# Patient Record
Sex: Male | Born: 1990 | Race: Black or African American | Hispanic: No | Marital: Single | Smoking: Never smoker
Health system: Southern US, Community
[De-identification: ages and names within clinical notes are randomized; demographics above are authoritative.]

## PROBLEM LIST (undated history)

## (undated) DIAGNOSIS — G4730 Sleep apnea, unspecified: Secondary | ICD-10-CM

## (undated) DIAGNOSIS — R569 Unspecified convulsions: Secondary | ICD-10-CM

## (undated) DIAGNOSIS — F99 Mental disorder, not otherwise specified: Secondary | ICD-10-CM

## (undated) DIAGNOSIS — I10 Essential (primary) hypertension: Secondary | ICD-10-CM

## (undated) DIAGNOSIS — J45909 Unspecified asthma, uncomplicated: Secondary | ICD-10-CM

---
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
## 2010-04-22 ENCOUNTER — Emergency Department (HOSPITAL_COMMUNITY): Admission: EM | Admit: 2010-04-22 | Discharge: 2010-04-22 | Payer: Self-pay | Admitting: Emergency Medicine

## 2010-04-25 ENCOUNTER — Ambulatory Visit: Payer: Self-pay | Admitting: Family Medicine

## 2010-04-25 ENCOUNTER — Encounter: Payer: Self-pay | Admitting: Family Medicine

## 2010-04-25 DIAGNOSIS — J329 Chronic sinusitis, unspecified: Secondary | ICD-10-CM | POA: Insufficient documentation

## 2010-04-25 DIAGNOSIS — R350 Frequency of micturition: Secondary | ICD-10-CM

## 2010-04-25 DIAGNOSIS — G4733 Obstructive sleep apnea (adult) (pediatric): Secondary | ICD-10-CM | POA: Insufficient documentation

## 2010-04-25 LAB — CONVERTED CEMR LAB
BUN: 9 mg/dL (ref 6–23)
Bilirubin Urine: NEGATIVE
CO2: 25 meq/L (ref 19–32)
Calcium: 9.8 mg/dL (ref 8.4–10.5)
Chloride: 99 meq/L (ref 96–112)
Creatinine, Ser: 0.87 mg/dL (ref 0.40–1.50)
Glucose, Bld: 81 mg/dL (ref 70–99)
Glucose, Urine, Semiquant: NEGATIVE
Ketones, urine, test strip: NEGATIVE
Nitrite: NEGATIVE
Potassium: 4.6 meq/L (ref 3.5–5.3)
Protein, U semiquant: 30
Sodium: 137 meq/L (ref 135–145)
Specific Gravity, Urine: 1.015
Urobilinogen, UA: 0.2
WBC Urine, dipstick: NEGATIVE
pH: 7

## 2010-04-26 ENCOUNTER — Encounter: Payer: Self-pay | Admitting: Family Medicine

## 2010-07-18 ENCOUNTER — Encounter: Payer: Self-pay | Admitting: Family Medicine

## 2010-07-18 ENCOUNTER — Ambulatory Visit (HOSPITAL_BASED_OUTPATIENT_CLINIC_OR_DEPARTMENT_OTHER): Admission: RE | Admit: 2010-07-18 | Discharge: 2010-07-18 | Payer: Self-pay | Admitting: Family Medicine

## 2010-07-22 ENCOUNTER — Ambulatory Visit: Payer: Self-pay | Admitting: Internal Medicine

## 2010-08-21 ENCOUNTER — Ambulatory Visit: Payer: Self-pay | Admitting: Family Medicine

## 2010-10-08 ENCOUNTER — Telehealth: Payer: Self-pay | Admitting: Family Medicine

## 2011-01-03 NOTE — Progress Notes (Signed)
Summary: refill  Phone Note Refill Request Call back at [phone] Message from:  Patient  Refills Requested: Medication #1:  CPAP WITH LARGE RESMED MIRAGE QUATTRO MASK 19 CWP AHI 0 per hour  Heated humidified air  Medication #2:  CLARINEX 5 MG TABS one tab by mouth daily for allergies  Medication #3:  FLONASE 50 MCG/ACT SUSP one spray in each nostril two times a day  Medication #4:  HYDROCHLOROTHIAZIDE 25 MG TABS one tab by mouth qday. pt has moved and lost rx for these meds including the Cpap machine. need written script for these  Initial call taken by: De Nurse,  October 08, 2010 1:46 PM    Prescriptions: HYDROCHLOROTHIAZIDE 25 MG TABS (HYDROCHLOROTHIAZIDE) one tab by mouth qday  #30 x 3   Entered and Authorized by:   Bobby Rumpf  MD   Signed by:   Bobby Rumpf  MD on 10/09/2010   Method used:   Print then Give to Patient   RxID:   [id number] CPAP WITH LARGE RESMED MIRAGE QUATTRO MASK 19 CWP AHI 0 per hour  Heated humidified air  #1 x 0   Entered and Authorized by:   Bobby Rumpf  MD   Signed by:   Bobby Rumpf  MD on 10/09/2010   Method used:   Print then Give to Patient   RxID:   [id number] PROVENTIL HFA 108 (90 BASE) MCG/ACT AERS (ALBUTEROL SULFATE) two puffs inhaled q 4 hours as needed wheezing or short of breath  #1 x 1   Entered and Authorized by:   Bobby Rumpf  MD   Signed by:   Bobby Rumpf  MD on 10/09/2010   Method used:   Print then Give to Patient   RxID:   [id number] FLONASE 50 MCG/ACT SUSP (FLUTICASONE PROPIONATE) one spray in each nostril two times a day  #120 sprays x 3   Entered and Authorized by:   Bobby Rumpf  MD   Signed by:   Bobby Rumpf  MD on 10/09/2010   Method used:   Print then Give to Patient   RxID:   [id number] CLARINEX 5 MG TABS (DESLORATADINE) one tab by mouth daily for allergies  #30 x 3   Entered and Authorized by:   Bobby Rumpf  MD   Signed by:   Bobby Rumpf  MD on 10/09/2010   Method used:   Print then Give to  Patient   RxID:   [id number]  in to be called box. sign  Bobby Rumpf  MD  October 09, 2010 10:19 AM
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
## 2011-01-03 NOTE — Letter (Signed)
Summary: Generic Letter  [facility]  [address]   [place], [place] [address]   Phone: [phone]  Fax: [phone]    04/26/2010  Andrew Larson [address] [place], [place]  [address]  Dear Andrew Larson,  I tried to call you but didn't get an answer. I wanted to let you know that your blood tests returned normal. We tested you electrolytes and kidney function. Your urinalysis showed tiny amount of blood and protein. We will plan to recheck this test on your next visit. Please call with any questions.  Sincerely,   Myrtie Soman  MD  Appended Document: Generic Letter mailed.

## 2011-01-28 ENCOUNTER — Emergency Department (HOSPITAL_COMMUNITY)
Admission: EM | Admit: 2011-01-28 | Discharge: 2011-01-28 | Discharge status: Against Medical Advice | Payer: Self-pay | Attending: Emergency Medicine | Admitting: Emergency Medicine

## 2011-01-28 ENCOUNTER — Emergency Department (HOSPITAL_COMMUNITY): Payer: Self-pay

## 2011-01-28 DIAGNOSIS — M25519 Pain in unspecified shoulder: Secondary | ICD-10-CM | POA: Insufficient documentation

## 2011-06-11 ENCOUNTER — Emergency Department (HOSPITAL_COMMUNITY)
Admission: EM | Admit: 2011-06-11 | Discharge: 2011-06-11 | Disposition: A | Discharge status: Home / Self Care | Payer: No Typology Code available for payment source | Attending: Emergency Medicine | Admitting: Emergency Medicine

## 2011-06-11 DIAGNOSIS — S139XXA Sprain of joints and ligaments of unspecified parts of neck, initial encounter: Secondary | ICD-10-CM | POA: Insufficient documentation

## 2011-06-11 DIAGNOSIS — I10 Essential (primary) hypertension: Secondary | ICD-10-CM | POA: Insufficient documentation

## 2011-06-11 DIAGNOSIS — Y92410 Unspecified street and highway as the place of occurrence of the external cause: Secondary | ICD-10-CM | POA: Insufficient documentation

## 2011-06-11 DIAGNOSIS — M62838 Other muscle spasm: Secondary | ICD-10-CM | POA: Insufficient documentation

## 2011-06-13 ENCOUNTER — Emergency Department (HOSPITAL_COMMUNITY)
Admission: EM | Admit: 2011-06-13 | Discharge: 2011-06-13 | Disposition: A | Discharge status: Home / Self Care | Payer: No Typology Code available for payment source | Attending: Emergency Medicine | Admitting: Emergency Medicine

## 2011-06-13 DIAGNOSIS — T148XXA Other injury of unspecified body region, initial encounter: Secondary | ICD-10-CM | POA: Insufficient documentation

## 2011-06-13 DIAGNOSIS — I10 Essential (primary) hypertension: Secondary | ICD-10-CM | POA: Insufficient documentation

## 2011-06-13 DIAGNOSIS — M549 Dorsalgia, unspecified: Secondary | ICD-10-CM | POA: Insufficient documentation

## 2011-06-13 DIAGNOSIS — M542 Cervicalgia: Secondary | ICD-10-CM | POA: Insufficient documentation

## 2012-04-01 ENCOUNTER — Ambulatory Visit: Payer: No Typology Code available for payment source | Admitting: Family Medicine

## 2012-04-07 ENCOUNTER — Ambulatory Visit: Payer: No Typology Code available for payment source | Admitting: Family Medicine

## 2012-07-27 ENCOUNTER — Ambulatory Visit (INDEPENDENT_AMBULATORY_CARE_PROVIDER_SITE_OTHER): Payer: Self-pay | Admitting: Family Medicine

## 2012-07-27 VITALS — BP 161/96 | HR 89 | Temp 98.1°F | Ht 71.0 in | Wt 370.4 lb

## 2012-07-27 DIAGNOSIS — G4733 Obstructive sleep apnea (adult) (pediatric): Secondary | ICD-10-CM

## 2012-07-27 DIAGNOSIS — E669 Obesity, unspecified: Secondary | ICD-10-CM

## 2012-07-27 DIAGNOSIS — R5381 Other malaise: Secondary | ICD-10-CM

## 2012-07-27 DIAGNOSIS — E785 Hyperlipidemia, unspecified: Secondary | ICD-10-CM

## 2012-07-27 DIAGNOSIS — J45909 Unspecified asthma, uncomplicated: Secondary | ICD-10-CM

## 2012-07-27 DIAGNOSIS — I10 Essential (primary) hypertension: Secondary | ICD-10-CM

## 2012-07-27 DIAGNOSIS — R5383 Other fatigue: Secondary | ICD-10-CM

## 2012-07-27 LAB — CBC
MCH: 26.7 pg (ref 26.0–34.0)
MCV: 80.4 fL (ref 78.0–100.0)
Platelets: 420 10*3/uL — ABNORMAL HIGH (ref 150–400)
RBC: 5.09 MIL/uL (ref 4.22–5.81)
RDW: 14.4 % (ref 11.5–15.5)

## 2012-07-27 MED ORDER — SPACER/AERO-HOLDING CHAMBERS KIT: 1.0000 | PACK | Status: DC | PRN

## 2012-07-27 MED ORDER — HYDROCHLOROTHIAZIDE 25 MG PO TABS: 25.0000 mg | ORAL_TABLET | Freq: Every day | ORAL | Status: DC

## 2012-07-27 MED ORDER — ALBUTEROL SULFATE HFA 108 (90 BASE) MCG/ACT IN AERS: 2.0000 | INHALATION_SPRAY | RESPIRATORY_TRACT | Status: DC | PRN

## 2012-07-27 NOTE — Patient Instructions (Addendum)
Thank you for coming in today Please start taking your albuterol as needed when you are short of breath Please start taking hour HCTZ Please come back to see me in 1-2 weeks Please arrange to have a sleeping study  Have a great day

## 2012-07-28 LAB — LIPID PANEL
Cholesterol: 193 mg/dL (ref 0–200)
LDL Cholesterol: 100 mg/dL — ABNORMAL HIGH (ref 0–99)
Total CHOL/HDL Ratio: 6 Ratio
Triglycerides: 303 mg/dL — ABNORMAL HIGH (ref <150)
VLDL: 61 mg/dL — ABNORMAL HIGH (ref 0–40)

## 2012-07-28 LAB — BASIC METABOLIC PANEL
BUN: 8 mg/dL (ref 6–23)
Chloride: 102 mEq/L (ref 96–112)
Glucose, Bld: 86 mg/dL (ref 70–99)
Potassium: 4.3 mEq/L (ref 3.5–5.3)
Sodium: 137 mEq/L (ref 135–145)

## 2012-07-30 NOTE — Assessment & Plan Note (Signed)
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 Sleep study CBC TSH

## 2012-07-30 NOTE — Assessment & Plan Note (Signed)
Start HCTZ troday

## 2012-07-30 NOTE — Assessment & Plan Note (Signed)
Refill albuterol w/ spacer today.

## 2012-07-30 NOTE — Assessment & Plan Note (Signed)
Severely obese. Pt would benefit greatly from meeting w/ health coach. Interested in losing wt but poor insight as to how to properly do this.  Lipid panel BMET

## 2012-07-30 NOTE — Assessment & Plan Note (Signed)
Previously seen in sleep lab for this but did not fill out Sleep lab

## 2012-07-30 NOTE — Progress Notes (Signed)
  Subjective:    Patient ID: Andrew Larson, male    DOB: 1991/09/22, 21 y.o.   MRN: [id number]  HPI CC: reestablish care. HTN, SOB, overweight  HTN: Hypertensive in office. Has not taken previously prescribbed HCTZ in years due to loss of medical coverage. Has been taking mothers bp pills. Not sure which they are. Denies HA, CP, palpitations, Syncope, diaphoresis  SOB: has had whole life. Dx w/ Asthma as child. owrsens w/ illnesses and change in seasons. No ER visits or hospitalizations. No current inhalers. Denies recent wheezing or coughing  Obesity: Pt aware of obesity. Dances for exercise 2-3hrs daily and walks around the block once occasionally. Eats once daily. Pt snores nightly, wakes up tired and often has HA in am  H/o epistaxis: No longer a problem. Has stopped using flonase  FamHX; Father w/ stroke and heart attack, cousin w/ DM, mother w/ HTN PMHx reviewed  Social hx reviewed Review of Systems Per HPI    Objective:   Physical Exam BP 161/96  Pulse 89  Temp 98.1 F (36.7 C) (Oral)  Ht 5\' 11"  (1.803 m)  Wt 370 lb 6.4 oz (168.012 kg)  BMI 51.66 kg/m2  SpO2 93%   Gen: Obese, NAD HEENt: TM normal bilat, neck soft, trachea midline, thyroid normal CV: RRR, no m/r/g RES: mild end expiratory wheezes, good air movement, normal effrot MUsc: normal ROM, no effusions Ext: No edema, 2+ pulses Skin: Acanthosis Nigricans of neck, intact Neuro: CN grossly intact, normal gait Psych: Pleasant, normal affect, AAOx3       Assessment & Plan:

## 2012-07-31 NOTE — Progress Notes (Signed)
Yes orange card can see me.  Let me know how I can help.

## 2012-08-07 NOTE — Progress Notes (Signed)
Call pt to set up health coaching appointment.  Phone number we have on record is disconnected.

## 2012-08-14 NOTE — Progress Notes (Signed)
Call pt to talk about fu visit with pcp and potential health coaching.  Pt's phone was turned off.

## 2012-08-17 ENCOUNTER — Ambulatory Visit (HOSPITAL_BASED_OUTPATIENT_CLINIC_OR_DEPARTMENT_OTHER): Payer: Self-pay

## 2012-10-27 ENCOUNTER — Ambulatory Visit (HOSPITAL_BASED_OUTPATIENT_CLINIC_OR_DEPARTMENT_OTHER): Payer: Self-pay

## 2012-11-10 ENCOUNTER — Encounter: Payer: Self-pay | Admitting: Family Medicine

## 2012-11-10 DIAGNOSIS — E785 Hyperlipidemia, unspecified: Secondary | ICD-10-CM | POA: Insufficient documentation

## 2012-11-10 NOTE — Assessment & Plan Note (Signed)
Multiple attempts have been made to contact pt but the phone number provided at time of last visit is not working. Need to discuss lipid panel w/ pt.  Hard copy mailed out

## 2012-12-20 ENCOUNTER — Emergency Department (HOSPITAL_COMMUNITY)
Admission: EM | Admit: 2012-12-20 | Discharge: 2012-12-20 | Disposition: A | Discharge status: Home / Self Care | Payer: Self-pay | Attending: Emergency Medicine | Admitting: Emergency Medicine

## 2012-12-20 ENCOUNTER — Encounter (HOSPITAL_COMMUNITY): Payer: Self-pay | Admitting: Emergency Medicine

## 2012-12-20 DIAGNOSIS — J329 Chronic sinusitis, unspecified: Secondary | ICD-10-CM

## 2012-12-20 DIAGNOSIS — Z8669 Personal history of other diseases of the nervous system and sense organs: Secondary | ICD-10-CM | POA: Insufficient documentation

## 2012-12-20 DIAGNOSIS — J069 Acute upper respiratory infection, unspecified: Secondary | ICD-10-CM | POA: Insufficient documentation

## 2012-12-20 DIAGNOSIS — E669 Obesity, unspecified: Secondary | ICD-10-CM | POA: Insufficient documentation

## 2012-12-20 DIAGNOSIS — J45909 Unspecified asthma, uncomplicated: Secondary | ICD-10-CM | POA: Insufficient documentation

## 2012-12-20 DIAGNOSIS — J019 Acute sinusitis, unspecified: Secondary | ICD-10-CM | POA: Insufficient documentation

## 2012-12-20 DIAGNOSIS — Z8709 Personal history of other diseases of the respiratory system: Secondary | ICD-10-CM | POA: Insufficient documentation

## 2012-12-20 DIAGNOSIS — Z79899 Other long term (current) drug therapy: Secondary | ICD-10-CM | POA: Insufficient documentation

## 2012-12-20 HISTORY — DX: Unspecified convulsions: R56.9

## 2012-12-20 HISTORY — DX: Sleep apnea, unspecified: G47.30

## 2012-12-20 HISTORY — DX: Essential (primary) hypertension: I10

## 2012-12-20 HISTORY — DX: Unspecified asthma, uncomplicated: J45.909

## 2012-12-20 MED ORDER — HYDROCHLOROTHIAZIDE 25 MG PO TABS: 25.0000 mg | ORAL_TABLET | Freq: Every day | ORAL | Status: DC

## 2012-12-20 MED ORDER — DM-GG & DM-APAP-CPM 10-20 &15-200-2 MG PO MISC: 1.0000 | Freq: Two times a day (BID) | ORAL | Status: DC

## 2012-12-20 NOTE — ED Notes (Addendum)
Pt states that he has a hx of hyperension and has been having headaches and light sensitivity since Friday. States his bp was 180/85 at home. He is supposed to take HCTZ but does not. Neuro intact.

## 2012-12-20 NOTE — ED Provider Notes (Signed)
History     CSN: [id number]  Arrival date & time 12/20/12  1816   First MD Initiated Contact with Patient 12/20/12 2053      Chief Complaint  Patient presents with  . Hypertension    (Consider location/radiation/quality/duration/timing/severity/associated sxs/prior treatment) HPI Comments: Patient presents with L facial pain, headache, sneezing for the past 3 days took Ibuprofen at 3AM with relief but the HA returned.  Has not taken any medication for nasal congestion, nor his blood pressure medication for the past 3 months.  The Rx is at [facility] he just has not gone to pick it up. Denies sore throat, CP cough, blurry vision, dizziness SOB.   Patient is a 22 y.o. male presenting with hypertension. The history is provided by the patient.  Hypertension This is a chronic problem. The current episode started more than 1 year ago. The problem occurs constantly. The problem has been unchanged. Associated symptoms include headaches. Pertinent negatives include no abdominal pain, anorexia, arthralgias, chest pain, chills, coughing, fatigue, fever, myalgias, nausea, rash, sore throat or weakness.    Past Medical History  Diagnosis Date  . Hypertension   . Seizures   . Sleep apnea   . Asthma     No past surgical history on file.  No family history on file.  History  Substance Use Topics  . Smoking status: Not on file  . Smokeless tobacco: Not on file  . Alcohol Use:       Review of Systems  Constitutional: Negative for fever, chills and fatigue.  HENT: Positive for rhinorrhea, sneezing and sinus pressure. Negative for sore throat.   Eyes: Negative for visual disturbance.  Respiratory: Negative for cough and shortness of breath.   Cardiovascular: Negative for chest pain and leg swelling.  Gastrointestinal: Negative for nausea, abdominal pain and anorexia.  Genitourinary: Negative.   Musculoskeletal: Negative for myalgias and arthralgias.  Skin: Negative for rash.    Neurological: Positive for headaches. Negative for dizziness and weakness.    Allergies  Review of patient's allergies indicates no known allergies.  Home Medications   Current Outpatient Rx  Name  Route  Sig  Dispense  Refill  . IBUPROFEN 200 MG PO TABS   Oral   Take 800 mg by mouth every 8 (eight) hours as needed. For pain.         . ALBUTEROL SULFATE HFA 108 (90 BASE) MCG/ACT IN AERS   Inhalation   Inhale 2 puffs into the lungs every 4 (four) hours as needed.   1 Inhaler   3   . DESLORATADINE 5 MG PO TABS   Oral   Take 5 mg by mouth daily.           Marland Kitchen DM-GG & DM-APAP-CPM 10-20 &15-200-2 MG PO MISC   Oral   Take 1 tablet by mouth 2 (two) times daily.   30 each   0   . HYDROCHLOROTHIAZIDE 25 MG PO TABS   Oral   Take 1 tablet (25 mg total) by mouth daily.   30 tablet   0   . SPACER/AERO-HOLDING CHAMBERS KIT   Does not apply   1 kit by Does not apply route as needed.   2 each   1     BP 151/99  Pulse 84  Temp 98.9 F (37.2 C)  SpO2 99%  Physical Exam  Constitutional: He is oriented to person, place, and time. He appears well-developed and well-nourished.       obese  HENT:  Head: Normocephalic.  Right Ear: External ear normal.  Left Ear: External ear normal.  Nose: Left sinus exhibits maxillary sinus tenderness and frontal sinus tenderness.  Mouth/Throat: Oropharynx is clear and moist.  Eyes: Pupils are equal, round, and reactive to light.  Neck: Normal range of motion.  Cardiovascular: Normal rate.   Pulmonary/Chest: Effort normal and breath sounds normal. He has no wheezes. He exhibits no tenderness.  Abdominal: Soft.  Musculoskeletal: Normal range of motion. He exhibits no edema and no tenderness.  Neurological: He is alert and oriented to person, place, and time.  Skin: Skin is warm and dry. No rash noted.    ED Course  Procedures (including critical care time)  Labs Reviewed - No data to display No results found.   1. URI (upper  respiratory infection)   2. Sinusitis       MDM   Discussed at length importance of taking his blood pressure mediation on a regular basis.  Also discussed OTC medications that are safe with HTN         Arman Filter, NP 12/20/12 2121

## 2012-12-23 ENCOUNTER — Encounter: Payer: Self-pay | Admitting: Family Medicine

## 2012-12-23 ENCOUNTER — Ambulatory Visit (INDEPENDENT_AMBULATORY_CARE_PROVIDER_SITE_OTHER): Payer: No Typology Code available for payment source | Admitting: Family Medicine

## 2012-12-23 VITALS — BP 146/95 | HR 92 | Temp 98.4°F | Ht 71.0 in | Wt 366.0 lb

## 2012-12-23 DIAGNOSIS — M79671 Pain in right foot: Secondary | ICD-10-CM

## 2012-12-23 DIAGNOSIS — I10 Essential (primary) hypertension: Secondary | ICD-10-CM

## 2012-12-23 DIAGNOSIS — M79609 Pain in unspecified limb: Secondary | ICD-10-CM

## 2012-12-23 DIAGNOSIS — J069 Acute upper respiratory infection, unspecified: Secondary | ICD-10-CM

## 2012-12-23 DIAGNOSIS — J329 Chronic sinusitis, unspecified: Secondary | ICD-10-CM

## 2012-12-23 MED ORDER — FLUTICASONE PROPIONATE 50 MCG/ACT NA SUSP: 2.0000 | Freq: Every day | NASAL | Status: DC

## 2012-12-23 NOTE — Patient Instructions (Addendum)
THank you for coming in today Your blood pressure is still elevated and taking your HCTZ is very important Please come back to see me in 1 month to follow up on your blood pressure Keeping this under control is very important to your long-term health Please start using Flonase every day. If you develop a bloody nose stop immediately and let me know Start using a saline nasal spray and mucinex for your sinus congestion and runny nose.  You may use pseudophed as well if your blood pressure is below 140/80 Start wearing supportive sneakers for your foot.  Rest and elevation are important to help it heal You may use 600 ibuprofen every 6 hours as needed for pain. Please call 24/7 if you have any questions Have a great day  Hypertension As your heart beats, it forces blood through your arteries. This force is your blood pressure. If the pressure is too high, it is called hypertension (HTN) or high blood pressure. HTN is dangerous because you may have it and not know it. High blood pressure may mean that your heart has to work harder to pump blood. Your arteries may be narrow or stiff. The extra work puts you at risk for heart disease, stroke, and other problems.  Blood pressure consists of two numbers, a higher number over a lower, 110/72, for example. It is stated as "110 over 72." The ideal is below 120 for the top number (systolic) and under 80 for the bottom (diastolic). Write down your blood pressure today. You should pay close attention to your blood pressure if you have certain conditions such as:  Heart failure.  Prior heart attack.  Diabetes  Chronic kidney disease.  Prior stroke.  Multiple risk factors for heart disease. To see if you have HTN, your blood pressure should be measured while you are seated with your arm held at the level of the heart. It should be measured at least twice. A one-time elevated blood pressure reading (especially in the Emergency Department) does not mean  that you need treatment. There may be conditions in which the blood pressure is different between your right and left arms. It is important to see your caregiver soon for a recheck. Most people have essential hypertension which means that there is not a specific cause. This type of high blood pressure may be lowered by changing lifestyle factors such as:  Stress.  Smoking.  Lack of exercise.  Excessive weight.  Drug/tobacco/alcohol use.  Eating less salt. Most people do not have symptoms from high blood pressure until it has caused damage to the body. Effective treatment can often prevent, delay or reduce that damage. TREATMENT  When a cause has been identified, treatment for high blood pressure is directed at the cause. There are a large number of medications to treat HTN. These fall into several categories, and your caregiver will help you select the medicines that are best for you. Medications may have side effects. You should review side effects with your caregiver. If your blood pressure stays high after you have made lifestyle changes or started on medicines,   Your medication(s) may need to be changed.  Other problems may need to be addressed.  Be certain you understand your prescriptions, and know how and when to take your medicine.  Be sure to follow up with your caregiver within the time frame advised (usually within two weeks) to have your blood pressure rechecked and to review your medications.  If you are taking more than  one medicine to lower your blood pressure, make sure you know how and at what times they should be taken. Taking two medicines at the same time can result in blood pressure that is too low. SEEK IMMEDIATE MEDICAL CARE IF:  You develop a severe headache, blurred or changing vision, or confusion.  You have unusual weakness or numbness, or a faint feeling.  You have severe chest or abdominal pain, vomiting, or breathing problems. MAKE SURE YOU:    Understand these instructions.  Will watch your condition.  Will get help right away if you are not doing well or get worse. Document Released: 11/18/2005 Document Revised: 02/10/2012 Document Reviewed: 07/08/2008 [facility] Patient Information 2013 [place], [place].   Upper Respiratory Infection, Adult An upper respiratory infection (URI) is also sometimes known as the common cold. The upper respiratory tract includes the nose, sinuses, throat, trachea, and bronchi. Bronchi are the airways leading to the lungs. Most people improve within 1 week, but symptoms can last up to 2 weeks. A residual cough may last even longer.  CAUSES Many different viruses can infect the tissues lining the upper respiratory tract. The tissues become irritated and inflamed and often become very moist. Mucus production is also common. A cold is contagious. You can easily spread the virus to others by oral contact. This includes kissing, sharing a glass, coughing, or sneezing. Touching your mouth or nose and then touching a surface, which is then touched by another person, can also spread the virus. SYMPTOMS  Symptoms typically develop 1 to 3 days after you come in contact with a cold virus. Symptoms vary from person to person. They may include:  Runny nose.  Sneezing.  Nasal congestion.  Sinus irritation.  Sore throat.  Loss of voice (laryngitis).  Cough.  Fatigue.  Muscle aches.  Loss of appetite.  Headache.  Low-grade fever. DIAGNOSIS  You might diagnose your own cold based on familiar symptoms, since most people get a cold 2 to 3 times a year. Your caregiver can confirm this based on your exam. Most importantly, your caregiver can check that your symptoms are not due to another disease such as strep throat, sinusitis, pneumonia, asthma, or epiglottitis. Blood tests, throat tests, and X-rays are not necessary to diagnose a common cold, but they may sometimes be helpful in excluding other more  serious diseases. Your caregiver will decide if any further tests are required. RISKS AND COMPLICATIONS  You may be at risk for a more severe case of the common cold if you smoke cigarettes, have chronic heart disease (such as heart failure) or lung disease (such as asthma), or if you have a weakened immune system. The very young and very old are also at risk for more serious infections. Bacterial sinusitis, middle ear infections, and bacterial pneumonia can complicate the common cold. The common cold can worsen asthma and chronic obstructive pulmonary disease (COPD). Sometimes, these complications can require emergency medical care and may be life-threatening. PREVENTION  The best way to protect against getting a cold is to practice good hygiene. Avoid oral or hand contact with people with cold symptoms. Wash your hands often if contact occurs. There is no clear evidence that vitamin C, vitamin E, echinacea, or exercise reduces the chance of developing a cold. However, it is always recommended to get plenty of rest and practice good nutrition. TREATMENT  Treatment is directed at relieving symptoms. There is no cure. Antibiotics are not effective, because the infection is caused by a virus, not by  bacteria. Treatment may include:  Increased fluid intake. Sports drinks offer valuable electrolytes, sugars, and fluids.  Breathing heated mist or steam (vaporizer or shower).  Eating chicken soup or other clear broths, and maintaining good nutrition.  Getting plenty of rest.  Using gargles or lozenges for comfort.  Controlling fevers with ibuprofen or acetaminophen as directed by your caregiver.  Increasing usage of your inhaler if you have asthma. Zinc gel and zinc lozenges, taken in the first 24 hours of the common cold, can shorten the duration and lessen the severity of symptoms. Pain medicines may help with fever, muscle aches, and throat pain. A variety of non-prescription medicines are  available to treat congestion and runny nose. Your caregiver can make recommendations and may suggest nasal or lung inhalers for other symptoms.  HOME CARE INSTRUCTIONS   Only take over-the-counter or prescription medicines for pain, discomfort, or fever as directed by your caregiver.  Use a warm mist humidifier or inhale steam from a shower to increase air moisture. This may keep secretions moist and make it easier to breathe.  Drink enough water and fluids to keep your urine clear or pale yellow.  Rest as needed.  Return to work when your temperature has returned to normal or as your caregiver advises. You may need to stay home longer to avoid infecting others. You can also use a face mask and careful hand washing to prevent spread of the virus. SEEK MEDICAL CARE IF:   After the first few days, you feel you are getting worse rather than better.  You need your caregiver's advice about medicines to control symptoms.  You develop chills, worsening shortness of breath, or brown or red sputum. These may be signs of pneumonia.  You develop yellow or brown nasal discharge or pain in the face, especially when you bend forward. These may be signs of sinusitis.  You develop a fever, swollen neck glands, pain with swallowing, or white areas in the back of your throat. These may be signs of strep throat. SEEK IMMEDIATE MEDICAL CARE IF:   You have a fever.  You develop severe or persistent headache, ear pain, sinus pain, or chest pain.  You develop wheezing, a prolonged cough, cough up blood, or have a change in your usual mucus (if you have chronic lung disease).  You develop sore muscles or a stiff neck. Document Released: 05/14/2001 Document Revised: 02/10/2012 Document Reviewed: 03/22/2011 [facility] Patient Information 2013 [place], [place].

## 2012-12-23 NOTE — Progress Notes (Signed)
Andrew Larson is a 22 y.o. male who presents to [facility] today for foot pain  Foot pain: started Sunday night. Wearing flat shoes for several days w/ slowly increasing pain. Started wearing flat shoes since November. Hurts in the middle. Better or worse w/ certain movements. No Rx taken  BP: Started taking HCTZ on Monday. Rx initially given back in August. Said too expensive or didn't think about it. Denies CP, palpitations  URI: cold symptoms start 7 days ago. ED on Sunday, told had viral URI. Sinus congestion, runny nose, puffy eyes. Tylenol cold w/ benefit. Denies fever, n/v/d/c.    The following portions of the patient's history were reviewed and updated as appropriate: allergies, current medications, past medical history, family and social history, and problem list.  Patient is a nonsmoker  Past Medical History  Diagnosis Date  . Hypertension   . Seizures   . Sleep apnea   . Asthma     ROS as above otherwise neg.    Medications reviewed. Current Outpatient Prescriptions  Medication Sig Dispense Refill  . albuterol (PROVENTIL HFA) 108 (90 BASE) MCG/ACT inhaler Inhale 2 puffs into the lungs every 4 (four) hours as needed.  1 Inhaler  3  . desloratadine (CLARINEX) 5 MG tablet Take 5 mg by mouth daily.        Marland Kitchen DM-GG & DM-APAP-CPM (CORICIDIN HBP DAY/NIGHT COLD) 10-20 &15-200-2 MG MISC Take 1 tablet by mouth 2 (two) times daily.  30 each  0  . hydrochlorothiazide (HYDRODIURIL) 25 MG tablet Take 1 tablet (25 mg total) by mouth daily.  30 tablet  0  . ibuprofen (ADVIL,MOTRIN) 200 MG tablet Take 800 mg by mouth every 8 (eight) hours as needed. For pain.      Marland Kitchen Spacer/Aero-Holding Chambers KIT 1 kit by Does not apply route as needed.  2 each  1    Exam: BP 146/95  Pulse 92  Temp 98.4 F (36.9 C) (Oral)  Ht 5\' 11"  (1.803 m)  Wt 366 lb (166.017 kg)  BMI 51.05 kg/m2 Gen: Well NAD HEENT: EOMI,  MMM MSK: pain on deep palpation of the lateral plantar surface of the R foot. ROM and  sensation preserved. No swelling or bony abnormality Exts: Non edematous BL  LE, warm and well perfused.   No results found for this or any previous visit (from the past 72 hour(s)).

## 2012-12-24 ENCOUNTER — Telehealth: Payer: Self-pay | Admitting: Family Medicine

## 2012-12-24 NOTE — Telephone Encounter (Signed)
Left message on vm to rtn my call letting me know were she would like the letter sent or faxed to. Aigner Horseman, Virgel Bouquet

## 2012-12-24 NOTE — Telephone Encounter (Signed)
Pt could not go to work again today and needs another note to out today

## 2012-12-25 ENCOUNTER — Encounter: Payer: Self-pay | Admitting: *Deleted

## 2012-12-27 NOTE — ED Provider Notes (Signed)
Medical screening examination/treatment/procedure(s) were performed by non-physician practitioner and as supervising physician I was immediately available for consultation/collaboration.  Giannina Bartolome M Nayzeth Altman, MD 12/27/12 2308 

## 2012-12-29 ENCOUNTER — Encounter: Payer: Self-pay | Admitting: Family Medicine

## 2012-12-29 DIAGNOSIS — J069 Acute upper respiratory infection, unspecified: Secondary | ICD-10-CM | POA: Insufficient documentation

## 2012-12-29 DIAGNOSIS — M79671 Pain in right foot: Secondary | ICD-10-CM | POA: Insufficient documentation

## 2012-12-29 NOTE — Assessment & Plan Note (Signed)
MSK pain. PT to purchase more supprotive footwear  Ibuprofen 600 PRN RICE discussed

## 2012-12-29 NOTE — Assessment & Plan Note (Signed)
Likely viral URI H/o allergic rhinitis and sinusitis Start flonase for relief Mucinex PRN Pseudophed if BP normalizes Precautions given

## 2012-12-29 NOTE — Assessment & Plan Note (Signed)
Continues to be elevated as pt only started his medication ~36hrs ago May need to titrate up vs add Lisinopril for renal protection Continue HCTZ

## 2013-01-22 ENCOUNTER — Ambulatory Visit (INDEPENDENT_AMBULATORY_CARE_PROVIDER_SITE_OTHER): Payer: Self-pay | Admitting: Family Medicine

## 2013-01-22 ENCOUNTER — Encounter: Payer: Self-pay | Admitting: Family Medicine

## 2013-01-22 VITALS — BP 160/90 | HR 88 | Ht 71.0 in | Wt 377.9 lb

## 2013-01-22 DIAGNOSIS — E785 Hyperlipidemia, unspecified: Secondary | ICD-10-CM

## 2013-01-22 DIAGNOSIS — E669 Obesity, unspecified: Secondary | ICD-10-CM

## 2013-01-22 DIAGNOSIS — J45909 Unspecified asthma, uncomplicated: Secondary | ICD-10-CM

## 2013-01-22 DIAGNOSIS — I10 Essential (primary) hypertension: Secondary | ICD-10-CM

## 2013-01-22 LAB — LDL CHOLESTEROL, DIRECT: Direct LDL: 127 mg/dL — ABNORMAL HIGH

## 2013-01-22 MED ORDER — LISINOPRIL 10 MG PO TABS: 10.0000 mg | ORAL_TABLET | Freq: Every day | ORAL | Status: DC

## 2013-01-22 NOTE — Patient Instructions (Addendum)
You are doing pretty well but we need to focus on getting your blood pressure down and your weight under control Please start taking the lisinopril. This is another blood pressure medicine Please come back to see me in 2 weeks to discuss your urinary concerns Please cut out all fatty/fried foods and start exercising.    Fat and Cholesterol Control Diet Cholesterol levels in your body are determined significantly by your diet. Cholesterol levels may also be related to heart disease. The following material helps to explain this relationship and discusses what you can do to help keep your heart healthy. Not all cholesterol is bad. Low-density lipoprotein (LDL) cholesterol is the "bad" cholesterol. It may cause fatty deposits to build up inside your arteries. High-density lipoprotein (HDL) cholesterol is "good." It helps to remove the "bad" LDL cholesterol from your blood. Cholesterol is a very important risk factor for heart disease. Other risk factors are high blood pressure, smoking, stress, heredity, and weight. The heart muscle gets its supply of blood through the coronary arteries. If your LDL cholesterol is high and your HDL cholesterol is low, you are at risk for having fatty deposits build up in your coronary arteries. This leaves less room through which blood can flow. Without sufficient blood and oxygen, the heart muscle cannot function properly and you may feel chest pains (angina pectoris). When a coronary artery closes up entirely, a part of the heart muscle may die causing a heart attack (myocardial infarction). CHECKING CHOLESTEROL When your caregiver sends your blood to a lab to be examined for cholesterol, a complete lipid (fat) profile may be done. With this test, the total amount of cholesterol and levels of LDL and HDL are determined. Triglycerides are a type of fat that circulates in the blood. They can also be used to determine heart disease risk. The list below describes what the  numbers should be: Test: Total Cholesterol.  Less than 200 mg/dl. Test: LDL "bad cholesterol."  Less than 100 mg/dl.  Less than 70 mg/dl if you are at very high risk of a heart attack or sudden cardiac death. Test: HDL "good cholesterol."  Greater than 50 mg/dl for women.  Greater than 40 mg/dl for men. Test: Triglycerides.  Less than 150 mg/dl. CONTROLLING CHOLESTEROL WITH DIET Although exercise and lifestyle factors are important, your diet is key. That is because certain foods are known to raise cholesterol and others to lower it. The goal is to balance foods for their effect on cholesterol and more importantly, to replace saturated and trans fat with other types of fat, such as monounsaturated fat, polyunsaturated fat, and omega-3 fatty acids. On average, a person should consume no more than 15 to 17 g of saturated fat daily. Saturated and trans fats are considered "bad" fats, and they will raise LDL cholesterol. Saturated fats are primarily found in animal products such as meats, butter, and cream. However, that does not mean you need to give up all your favorite foods. Today, there are good tasting, low-fat, low-cholesterol substitutes for most of the things you like to eat. Choose low-fat or nonfat alternatives. Choose round or loin cuts of red meat. These types of cuts are lowest in fat and cholesterol. Chicken (without the skin), fish, veal, and ground [place] breast are great choices. Eliminate fatty meats, such as hot dogs and salami. Even shellfish have little or no saturated fat. Have a 3 oz (85 g) portion when you eat lean meat, poultry, or fish. Trans fats are also called "  partially hydrogenated oils." They are oils that have been scientifically manipulated so that they are solid at room temperature resulting in a longer shelf life and improved taste and texture of foods in which they are added. Trans fats are found in stick margarine, some tub margarines, cookies, crackers, and  baked goods.  When baking and cooking, oils are a great substitute for butter. The monounsaturated oils are especially beneficial since it is believed they lower LDL and raise HDL. The oils you should avoid entirely are saturated tropical oils, such as coconut and palm.  Remember to eat a lot from food groups that are naturally free of saturated and trans fat, including fish, fruit, vegetables, beans, grains (barley, rice, couscous, bulgur wheat), and pasta (without cream sauces).  IDENTIFYING FOODS THAT LOWER CHOLESTEROL  Soluble fiber may lower your cholesterol. This type of fiber is found in fruits such as apples, vegetables such as broccoli, potatoes, and carrots, legumes such as beans, peas, and lentils, and grains such as barley. Foods fortified with plant sterols (phytosterol) may also lower cholesterol. You should eat at least 2 g per day of these foods for a cholesterol lowering effect.  Read package labels to identify low-saturated fats, trans fat free, and low-fat foods at the supermarket. Select cheeses that have only 2 to 3 g saturated fat per ounce. Use a heart-healthy tub margarine that is free of trans fats or partially hydrogenated oil. When buying baked goods (cookies, crackers), avoid partially hydrogenated oils. Breads and muffins should be made from whole grains (whole-wheat or whole oat flour, instead of "flour" or "enriched flour"). Buy non-creamy canned soups with reduced salt and no added fats.  FOOD PREPARATION TECHNIQUES  Never deep-fry. If you must fry, either stir-fry, which uses very little fat, or use non-stick cooking sprays. When possible, broil, bake, or roast meats, and steam vegetables. Instead of putting butter or margarine on vegetables, use lemon and herbs, applesauce, and cinnamon (for squash and sweet potatoes), nonfat yogurt, salsa, and low-fat dressings for salads.  LOW-SATURATED FAT / LOW-FAT FOOD SUBSTITUTES Meats / Saturated Fat (g)  Avoid: Steak, marbled (3  oz/85 g) / 11 g  Choose: Steak, lean (3 oz/85 g) / 4 g  Avoid: Hamburger (3 oz/85 g) / 7 g  Choose: Hamburger, lean (3 oz/85 g) / 5 g  Avoid: Ham (3 oz/85 g) / 6 g  Choose: Ham, lean cut (3 oz/85 g) / 2.4 g  Avoid: Chicken, with skin, dark meat (3 oz/85 g) / 4 g  Choose: Chicken, skin removed, dark meat (3 oz/85 g) / 2 g  Avoid: Chicken, with skin, light meat (3 oz/85 g) / 2.5 g  Choose: Chicken, skin removed, light meat (3 oz/85 g) / 1 g Dairy / Saturated Fat (g)  Avoid: Whole milk (1 cup) / 5 g  Choose: Low-fat milk, 2% (1 cup) / 3 g  Choose: Low-fat milk, 1% (1 cup) / 1.5 g  Choose: Skim milk (1 cup) / 0.3 g  Avoid: Hard cheese (1 oz/28 g) / 6 g  Choose: Skim milk cheese (1 oz/28 g) / 2 to 3 g  Avoid: Cottage cheese, 4% fat (1 cup) / 6.5 g  Choose: Low-fat cottage cheese, 1% fat (1 cup) / 1.5 g  Avoid: Ice cream (1 cup) / 9 g  Choose: Sherbet (1 cup) / 2.5 g  Choose: Nonfat frozen yogurt (1 cup) / 0.3 g  Choose: Frozen fruit bar / trace  Avoid: Whipped cream (1 tbs) /  3.5 g  Choose: Nondairy whipped topping (1 tbs) / 1 g Condiments / Saturated Fat (g)  Avoid: Mayonnaise (1 tbs) / 2 g  Choose: Low-fat mayonnaise (1 tbs) / 1 g  Avoid: Butter (1 tbs) / 7 g  Choose: Extra light margarine (1 tbs) / 1 g  Avoid: Coconut oil (1 tbs) / 11.8 g  Choose: Olive oil (1 tbs) / 1.8 g  Choose: Corn oil (1 tbs) / 1.7 g  Choose: Safflower oil (1 tbs) / 1.2 g  Choose: Sunflower oil (1 tbs) / 1.4 g  Choose: Soybean oil (1 tbs) / 2.4 g  Choose: Canola oil (1 tbs) / 1 g Document Released: 11/18/2005 Document Revised: 02/10/2012 Document Reviewed: 05/09/2011 [facility] Patient Information 2013 [place], [place].

## 2013-01-22 NOTE — Assessment & Plan Note (Signed)
Still elevated Continue HCTZ Starting Lisinopril

## 2013-01-22 NOTE — Assessment & Plan Note (Signed)
Direct LDL today  

## 2013-01-22 NOTE — Progress Notes (Signed)
Andrew Larson is a 22 y.o. male who presents to [facility] today for HTN f/u  HTN: taking HCTZ as prescribed. Denies CP, SOB, Palpitations.   Wt: Up 11lbs from previous exam. Poor diet. Eating burgers lately at work. Not exercising. Just got a gym membership and will start giong to the gym. Drinking whole milk. Lots of dressing on vegetables.   Asthma: uses albuterol approximately 2-3x wkly. No nightime dosing.   The following portions of the patient's history were reviewed and updated as appropriate: allergies, current medications, past medical history, family and social history, and problem list.  Patient is a nonsmoker.  Past Medical History  Diagnosis Date  . Hypertension   . Seizures   . Sleep apnea   . Asthma     ROS as above otherwise neg.    Medications reviewed. Current Outpatient Prescriptions  Medication Sig Dispense Refill  . albuterol (PROVENTIL HFA) 108 (90 BASE) MCG/ACT inhaler Inhale 2 puffs into the lungs every 4 (four) hours as needed.  1 Inhaler  3  . desloratadine (CLARINEX) 5 MG tablet Take 5 mg by mouth daily.        Marland Kitchen DM-GG & DM-APAP-CPM (CORICIDIN HBP DAY/NIGHT COLD) 10-20 &15-200-2 MG MISC Take 1 tablet by mouth 2 (two) times daily.  30 each  0  . fluticasone (FLONASE) 50 MCG/ACT nasal spray Place 2 sprays into the nose daily.  16 g  6  . hydrochlorothiazide (HYDRODIURIL) 25 MG tablet Take 1 tablet (25 mg total) by mouth daily.  30 tablet  0  . ibuprofen (ADVIL,MOTRIN) 200 MG tablet Take 800 mg by mouth every 8 (eight) hours as needed. For pain.      Marland Kitchen Spacer/Aero-Holding Chambers KIT 1 kit by Does not apply route as needed.  2 each  1   No current facility-administered medications for this visit.    Exam:  BP 160/90  Pulse 88  Ht 5\' 11"  (1.803 m)  Wt 377 lb 14.4 oz (171.414 kg)  BMI 52.73 kg/m2 Gen: Well NAD HEENT: EOMI,  MMM Lungs: Nl WOB Abd: obese Neuro: CN grossly intact. Ambulation nml  No results found for this or any previous visit (from the  past 72 hour(s)).

## 2013-01-22 NOTE — Assessment & Plan Note (Signed)
Only using Q2-3 wkly, and not entirely convinced that using for exacerbation vs being winded from being out of shape.  No change

## 2013-01-22 NOTE — Assessment & Plan Note (Signed)
Spent >5 minutes discussing wt and healthy food options Pt to start going to gym.  Encouraged

## 2013-02-23 ENCOUNTER — Ambulatory Visit: Payer: Self-pay | Admitting: Family Medicine

## 2013-12-27 ENCOUNTER — Ambulatory Visit: Payer: Self-pay

## 2013-12-28 ENCOUNTER — Encounter: Payer: Self-pay | Admitting: Family Medicine

## 2014-01-06 ENCOUNTER — Ambulatory Visit: Payer: Self-pay

## 2014-01-07 ENCOUNTER — Encounter: Payer: Self-pay | Admitting: Family Medicine

## 2014-01-07 ENCOUNTER — Ambulatory Visit (INDEPENDENT_AMBULATORY_CARE_PROVIDER_SITE_OTHER): Payer: No Typology Code available for payment source | Admitting: Family Medicine

## 2014-01-07 VITALS — BP 164/98 | HR 88 | Temp 98.6°F | Ht 71.0 in | Wt 386.0 lb

## 2014-01-07 DIAGNOSIS — E669 Obesity, unspecified: Secondary | ICD-10-CM

## 2014-01-07 DIAGNOSIS — Z23 Encounter for immunization: Secondary | ICD-10-CM

## 2014-01-07 DIAGNOSIS — E785 Hyperlipidemia, unspecified: Secondary | ICD-10-CM

## 2014-01-07 DIAGNOSIS — K649 Unspecified hemorrhoids: Secondary | ICD-10-CM

## 2014-01-07 DIAGNOSIS — G4733 Obstructive sleep apnea (adult) (pediatric): Secondary | ICD-10-CM

## 2014-01-07 DIAGNOSIS — I10 Essential (primary) hypertension: Secondary | ICD-10-CM

## 2014-01-07 MED ORDER — HYDROCHLOROTHIAZIDE 25 MG PO TABS: 25.0000 mg | ORAL_TABLET | Freq: Every day | ORAL | Status: DC

## 2014-01-07 MED ORDER — LISINOPRIL 10 MG PO TABS: 10.0000 mg | ORAL_TABLET | Freq: Every day | ORAL | Status: DC

## 2014-01-07 NOTE — Assessment & Plan Note (Signed)
Primarily prolapsed or bleeding internal hemorrhoids.  No need for in office banding/excision at this time. Will treat concervatively Inc fiber in diet, adn fluid intake, miralax, and wt loss and HTN control

## 2014-01-07 NOTE — Progress Notes (Signed)
Andrew Larson is a 23 y.o. male who presents to [facility] today for physical  Narcolepsy: Previously evaluated adn sent to sleep study but has not gone as missed appt and told needs another appt. Falls asleep during the day. Falls asleep at stop light. Wakes up tired in the morning. Sleeps at least 8 hrs at night. Snoring and witness apnea at home.   Hemorrhoids: present for several months. Bleed daily w/ BM. One is out of the anus. Occasionally painful. Hard to soft BM.   HTN: not taken any BP meds for >69yr. Says he didn't have insurance. Denies CP, SOB, palpitations. Occasional HA.   The following portions of the patient's history were reviewed and updated as appropriate: allergies, current medications, past medical history, family and social history, and problem list.  Patient is a nonsmoker.  Past Medical History  Diagnosis Date  . Hypertension   . Seizures   . Sleep apnea   . Asthma     ROS as above otherwise neg.    Medications reviewed. Current Outpatient Prescriptions  Medication Sig Dispense Refill  . albuterol (PROVENTIL HFA) 108 (90 BASE) MCG/ACT inhaler Inhale 2 puffs into the lungs every 4 (four) hours as needed.  1 Inhaler  3  . desloratadine (CLARINEX) 5 MG tablet Take 5 mg by mouth daily.        Marland Kitchen DM-GG & DM-APAP-CPM (CORICIDIN HBP DAY/NIGHT COLD) 10-20 &15-200-2 MG MISC Take 1 tablet by mouth 2 (two) times daily.  30 each  0  . fluticasone (FLONASE) 50 MCG/ACT nasal spray Place 2 sprays into the nose daily.  16 g  6  . hydrochlorothiazide (HYDRODIURIL) 25 MG tablet Take 1 tablet (25 mg total) by mouth daily.  90 tablet  3  . ibuprofen (ADVIL,MOTRIN) 200 MG tablet Take 800 mg by mouth every 8 (eight) hours as needed. For pain.      Marland Kitchen lisinopril (PRINIVIL,ZESTRIL) 10 MG tablet Take 1 tablet (10 mg total) by mouth daily.  90 tablet  3  . Spacer/Aero-Holding Chambers KIT 1 kit by Does not apply route as needed.  2 each  1   No current facility-administered medications for  this visit.    Exam:  BP 164/98  Pulse 88  Temp(Src) 98.6 F (37 C) (Oral)  Ht $R'5\' 11"'eC$  (1.803 m)  Wt 386 lb (175.088 kg)  BMI 53.86 kg/m2 Gen: Well NAD HEENT: EOMI,  MMM Lungs: CTABL Nl WOB Heart: RRR no MRG Abd: NABS, NT, ND Exts: Non edematous BL  LE, warm and well perfused.  one large prolapsed non-painful hemorrhoid  No results found for this or any previous visit (from the past 12 hour(s)).  A/P (as seen in Problem list)  HYPERTENSION, BENIGN SYSTEMIC Refilled meds Reiterated the importance of taking meds F/u in 2 wks in my office.    SLEEP APNEA, OBSTRUCTIVE, SEVERE Sleep study reordered. 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  No need for in office banding/excision at this time. Will treat concervatively Inc fiber in diet, adn fluid intake, miralax, and wt loss and HTN control

## 2014-01-07 NOTE — Patient Instructions (Signed)
Thank you for coming in today You must get your blood pressure under control Please come back to see me in 2 weeks Please come in for fasting blood work prior to that appointment Please treat your hemorrhoids by decreasing your blood pressure, increasing the fiber in y our diet (metamucil), increasing your fluid intake, and starting miralax for daily bowel movements Please follow up with the sleep study lab for your study Please try to lose weight.

## 2014-01-07 NOTE — Assessment & Plan Note (Signed)
Sleep study reordered. 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

## 2014-01-07 NOTE — Assessment & Plan Note (Signed)
Refilled meds Reiterated the importance of taking meds F/u in 2 wks in my office.

## 2014-01-12 ENCOUNTER — Other Ambulatory Visit: Payer: No Typology Code available for payment source

## 2014-01-19 ENCOUNTER — Other Ambulatory Visit (INDEPENDENT_AMBULATORY_CARE_PROVIDER_SITE_OTHER): Payer: No Typology Code available for payment source

## 2014-01-19 DIAGNOSIS — E669 Obesity, unspecified: Secondary | ICD-10-CM

## 2014-01-19 DIAGNOSIS — E785 Hyperlipidemia, unspecified: Secondary | ICD-10-CM

## 2014-01-19 LAB — COMPREHENSIVE METABOLIC PANEL
ALBUMIN: 4.4 g/dL (ref 3.5–5.2)
ALT: 39 U/L (ref 0–53)
AST: 29 U/L (ref 0–37)
Alkaline Phosphatase: 63 U/L (ref 39–117)
BUN: 9 mg/dL (ref 6–23)
CALCIUM: 9.4 mg/dL (ref 8.4–10.5)
CHLORIDE: 101 meq/L (ref 96–112)
CO2: 29 meq/L (ref 19–32)
Creat: 0.84 mg/dL (ref 0.50–1.35)
Glucose, Bld: 85 mg/dL (ref 70–99)
POTASSIUM: 4.6 meq/L (ref 3.5–5.3)
SODIUM: 139 meq/L (ref 135–145)
TOTAL PROTEIN: 7.1 g/dL (ref 6.0–8.3)
Total Bilirubin: 0.3 mg/dL (ref 0.2–1.2)

## 2014-01-19 LAB — LIPID PANEL
Cholesterol: 171 mg/dL (ref 0–200)
HDL: 33 mg/dL — ABNORMAL LOW (ref >39)
LDL CALC: 103 mg/dL — AB (ref 0–99)
Total CHOL/HDL Ratio: 5.2 Ratio
Triglycerides: 174 mg/dL — ABNORMAL HIGH (ref <150)
VLDL: 35 mg/dL (ref 0–40)

## 2014-01-19 LAB — POCT GLYCOSYLATED HEMOGLOBIN (HGB A1C): Hemoglobin A1C: 6.2

## 2014-01-19 NOTE — Progress Notes (Signed)
CMP,FLP AND A1C DONE TODAY Andrew Larson 

## 2014-01-21 ENCOUNTER — Ambulatory Visit (INDEPENDENT_AMBULATORY_CARE_PROVIDER_SITE_OTHER): Payer: No Typology Code available for payment source | Admitting: Family Medicine

## 2014-01-21 ENCOUNTER — Encounter: Payer: Self-pay | Admitting: Family Medicine

## 2014-01-21 VITALS — BP 160/88 | HR 89 | Temp 98.4°F | Wt 393.0 lb

## 2014-01-21 DIAGNOSIS — E669 Obesity, unspecified: Secondary | ICD-10-CM

## 2014-01-21 DIAGNOSIS — I10 Essential (primary) hypertension: Secondary | ICD-10-CM

## 2014-01-21 DIAGNOSIS — R7303 Prediabetes: Secondary | ICD-10-CM | POA: Insufficient documentation

## 2014-01-21 DIAGNOSIS — G4733 Obstructive sleep apnea (adult) (pediatric): Secondary | ICD-10-CM

## 2014-01-21 DIAGNOSIS — R7309 Other abnormal glucose: Secondary | ICD-10-CM

## 2014-01-21 DIAGNOSIS — K649 Unspecified hemorrhoids: Secondary | ICD-10-CM

## 2014-01-21 MED ORDER — AMLODIPINE BESYLATE 5 MG PO TABS: 5.0000 mg | ORAL_TABLET | Freq: Every day | ORAL | Status: DC

## 2014-01-21 NOTE — Assessment & Plan Note (Signed)
Pre DM.  Pt w/ poor insight into nutrition and diabetes Significant teaching undertaken Recheck a1c in 3 mo

## 2014-01-21 NOTE — Assessment & Plan Note (Signed)
Pt has not been contacted yet Pt to call as still a concern for pt as waking up tired and w/ HA nearly daily

## 2014-01-21 NOTE — Patient Instructions (Addendum)
Your blood pressure is still out of control Please continue the lisinopril and start the amlodipine Please call the sleep center to try and get yourself an appointment The surgery team will call you for an appointment for your hemorrhoids Please cut down on the soda and fried foods and increase your water and fiber intake Please call Dr. Gerilyn PilgrimSykes for a nutrition appointment Please come back in 2 weeks for a nursing blood pressure check and in 4 weeks to meet with me Please call sooner if needed

## 2014-01-21 NOTE — Assessment & Plan Note (Signed)
Morbidly obese. 7lb wt gain in 2 wks Discussed healthy eating.  Advised to contact nutritionist for appt (Dr. Gerilyn PilgrimSykes)

## 2014-01-21 NOTE — Assessment & Plan Note (Signed)
Still elevated. Genetic predisposition as well as OSA as well as sedentary lifestyle Cont lisinopril Start amlodipine Nurse check in 2 wks Return in 4 wks

## 2014-01-21 NOTE — Assessment & Plan Note (Addendum)
Preparation H w/o relief Daily bleeding Miralax now w/ daily BM Pt diet and HTN likely main contributors Inc fiber in diet Refer to Surgery for eval and excision See previous PE

## 2014-01-21 NOTE — Progress Notes (Signed)
Andrew Larson is a 23 y.o. male who presents to [facility] today for HTN and hemorrhoids  HTN: taking lisinopril. No CP, SOB, Palpitations  Hemorrhoids: worse after using preparation H. Using Miralax w/ daily soft BM. No real increase in fiber in diet yet. Nearly daily bleeding. Sometimes painful.   Obesity: 4-5 2L bottles of soda per day. Eats fried foods. Also drinks lots fo sweat tea. Poor insight into nutrition  OSA: continues to be tired every monring and w/ HA. HAs not been contacted by sleep center  The following portions of the patient's history were reviewed and updated as appropriate: allergies, current medications, past medical history, family and social history, and problem list.  Patient is a nonsmoker.   Past Medical History  Diagnosis Date  . Hypertension   . Seizures   . Sleep apnea   . Asthma     ROS as above otherwise neg.    Medications reviewed. Current Outpatient Prescriptions  Medication Sig Dispense Refill  . albuterol (PROVENTIL HFA) 108 (90 BASE) MCG/ACT inhaler Inhale 2 puffs into the lungs every 4 (four) hours as needed.  1 Inhaler  3  . amLODipine (NORVASC) 5 MG tablet Take 1 tablet (5 mg total) by mouth daily.  30 tablet  6  . desloratadine (CLARINEX) 5 MG tablet Take 5 mg by mouth daily.        Marland Kitchen DM-GG & DM-APAP-CPM (CORICIDIN HBP DAY/NIGHT COLD) 10-20 &15-200-2 MG MISC Take 1 tablet by mouth 2 (two) times daily.  30 each  0  . fluticasone (FLONASE) 50 MCG/ACT nasal spray Place 2 sprays into the nose daily.  16 g  6  . hydrochlorothiazide (HYDRODIURIL) 25 MG tablet Take 1 tablet (25 mg total) by mouth daily.  90 tablet  3  . ibuprofen (ADVIL,MOTRIN) 200 MG tablet Take 800 mg by mouth every 8 (eight) hours as needed. For pain.      Marland Kitchen lisinopril (PRINIVIL,ZESTRIL) 10 MG tablet Take 1 tablet (10 mg total) by mouth daily.  90 tablet  3  . Spacer/Aero-Holding Chambers KIT 1 kit by Does not apply route as needed.  2 each  1   No current facility-administered  medications for this visit.    Exam:  BP 160/88  Pulse 89  Temp(Src) 98.4 F (36.9 C) (Oral)  Wt 393 lb (178.264 kg) Gen: Well NAD HEENT: EOMI,  MMM   Results for orders placed in visit on 01/19/14 (from the past 72 hour(s))  POCT GLYCOSYLATED HEMOGLOBIN (HGB A1C)     Status: None   Collection Time    01/19/14  1:33 PM      Result Value Ref Range   Hemoglobin A1C 6.2    LIPID PANEL     Status: Abnormal   Collection Time    01/19/14  1:34 PM      Result Value Ref Range   Cholesterol 171  0 - 200 mg/dL   Comment: ATP III Classification:           < 200        mg/dL        Desirable          200 - 239     mg/dL        Borderline High          >= 240        mg/dL        High         Triglycerides 174 (*) <150  mg/dL   HDL 33 (*) >39 mg/dL   Total CHOL/HDL Ratio 5.2     VLDL 35  0 - 40 mg/dL   LDL Cholesterol 103 (*) 0 - 99 mg/dL   Comment:       Total Cholesterol/HDL Ratio:CHD Risk                            Coronary Heart Disease Risk Table                                            Men       Women              1/2 Average Risk              3.4        3.3                  Average Risk              5.0        4.4               2X Average Risk              9.6        7.1               3X Average Risk             23.4       11.0     Use the calculated Patient Ratio above and the CHD Risk table      to determine the patient's CHD Risk.     ATP III Classification (LDL):           < 100        mg/dL         Optimal          100 - 129     mg/dL         Near or Above Optimal          130 - 159     mg/dL         Borderline High          160 - 189     mg/dL         High           > 190        mg/dL         Very High        COMPREHENSIVE METABOLIC PANEL     Status: None   Collection Time    01/19/14  1:34 PM      Result Value Ref Range   Sodium 139  135 - 145 mEq/L   Potassium 4.6  3.5 - 5.3 mEq/L   Chloride 101  96 - 112 mEq/L   CO2 29  19 - 32 mEq/L   Glucose, Bld 85  70  - 99 mg/dL   BUN 9  6 - 23 mg/dL   Creat 0.84  0.50 - 1.35 mg/dL   Total Bilirubin 0.3  0.2 - 1.2 mg/dL   Comment: ** Please note change in reference range(s). **   Alkaline Phosphatase 63  39 - 117 U/L   AST 29  0 -  37 U/L   ALT 39  0 - 53 U/L   Total Protein 7.1  6.0 - 8.3 g/dL   Albumin 4.4  3.5 - 5.2 g/dL   Calcium 9.4  8.4 - 10.5 mg/dL    A/P (as seen in Problem list)  SLEEP APNEA, OBSTRUCTIVE, SEVERE Pt has not been contacted yet Pt to call as still a concern for pt as waking up tired and w/ HA nearly daily  OBESITY, NOS Morbidly obese. 7lb wt gain in 2 wks Discussed healthy eating.  Advised to contact nutritionist for appt (Dr. Jenne Campus)   HYPERTENSION, BENIGN SYSTEMIC Still elevated. Genetic predisposition as well as OSA as well as sedentary lifestyle Cont lisinopril Start amlodipine Nurse check in 2 wks Return in 4 wks  Hemorrhoids Preparation H w/o relief Daily bleeding Miralax now w/ daily BM Pt diet and HTN likely main contributors Inc fiber in diet Refer to Surgery for eval and excision See previous PE    Pre-diabetes Pre DM.  Pt w/ poor insight into nutrition and diabetes Significant teaching undertaken Recheck a1c in 3 mo

## 2014-01-26 ENCOUNTER — Encounter (INDEPENDENT_AMBULATORY_CARE_PROVIDER_SITE_OTHER): Payer: Self-pay | Admitting: General Surgery

## 2014-01-26 ENCOUNTER — Ambulatory Visit (INDEPENDENT_AMBULATORY_CARE_PROVIDER_SITE_OTHER): Payer: PRIVATE HEALTH INSURANCE | Admitting: General Surgery

## 2014-01-26 VITALS — BP 160/120 | HR 84 | Temp 97.9°F | Resp 16 | Ht 73.0 in | Wt 393.0 lb

## 2014-01-26 DIAGNOSIS — R198 Other specified symptoms and signs involving the digestive system and abdomen: Secondary | ICD-10-CM

## 2014-01-26 DIAGNOSIS — K644 Residual hemorrhoidal skin tags: Secondary | ICD-10-CM

## 2014-01-26 DIAGNOSIS — K6289 Other specified diseases of anus and rectum: Secondary | ICD-10-CM | POA: Insufficient documentation

## 2014-01-26 NOTE — Progress Notes (Signed)
Patient ID: Andrew Larson, male   DOB: May 27, 1991, 23 y.o.   MRN: [id number]  Chief Complaint  Patient presents with  . New Evaluation    eval int/ext hems    HPI Andrew Larson is a 23 y.o. male.  Chief complaint: Anal pain, hemorrhoids HPI Patient has a several month history of anal pain. He has had a small amount of bleeding. Pain is exacerbated by bowel movements. He is followed by the family practice clinic at [facility], Dr.Merrell. Dr. Konrad Dolores asked me to see him in consultation regarding possible hemorrhoidal disease. He has tried Preparation H both cream and wipes without much relief.  Past Medical History  Diagnosis Date  . Hypertension   . Seizures   . Sleep apnea   . Asthma     History reviewed. No pertinent past surgical history.  Family History  Problem Relation Age of Onset  . Hypertension Mother   . Diabetes Mother   . Hypertension Father     Social History History  Substance Use Topics  . Smoking status: Former Games developer  . Smokeless tobacco: Never Used  . Alcohol Use: No  History of anal sex in the past, not current  No Known Allergies  Current Outpatient Prescriptions  Medication Sig Dispense Refill  . albuterol (PROVENTIL HFA) 108 (90 BASE) MCG/ACT inhaler Inhale 2 puffs into the lungs every 4 (four) hours as needed.  1 Inhaler  3  . amLODipine (NORVASC) 5 MG tablet Take 1 tablet (5 mg total) by mouth daily.  30 tablet  6  . hydrochlorothiazide (HYDRODIURIL) 25 MG tablet Take 1 tablet (25 mg total) by mouth daily.  90 tablet  3  . ibuprofen (ADVIL,MOTRIN) 200 MG tablet Take 800 mg by mouth every 8 (eight) hours as needed. For pain.      Marland Kitchen lisinopril (PRINIVIL,ZESTRIL) 10 MG tablet Take 1 tablet (10 mg total) by mouth daily.  90 tablet  3   No current facility-administered medications for this visit.    Review of Systems Review of Systems  Constitutional: Negative for fever, chills and unexpected weight change.  HENT: Negative for congestion,  hearing loss, sore throat, trouble swallowing and voice change.   Eyes: Negative for visual disturbance.  Respiratory: Negative for cough and wheezing.   Cardiovascular: Negative for chest pain, palpitations and leg swelling.  Gastrointestinal: Positive for blood in stool, anal bleeding and rectal pain. Negative for nausea, vomiting, abdominal pain, diarrhea, constipation and abdominal distention.       See history of present illness  Genitourinary: Negative for hematuria and difficulty urinating.  Musculoskeletal: Negative for arthralgias.  Skin: Negative for rash and wound.  Neurological: Negative for seizures, syncope, weakness and headaches.  Hematological: Negative for adenopathy. Does not bruise/bleed easily.  Psychiatric/Behavioral: Negative for confusion.    Blood pressure 160/120, pulse 84, temperature 97.9 F (36.6 C), temperature source Oral, resp. rate 16, height 6\' 1"  (1.854 m), weight 393 lb (178.264 kg).  Physical Exam Physical Exam  Constitutional: He appears well-developed and well-nourished.  HENT:  Head: Normocephalic and atraumatic.  Eyes: EOM are normal. Pupils are equal, round, and reactive to light. No scleral icterus.  Neck: Normal range of motion. Neck supple. No tracheal deviation present.  Cardiovascular: Normal rate, normal heart sounds and intact distal pulses.   Pulmonary/Chest: Effort normal and breath sounds normal. No stridor. No respiratory distress. He has no wheezes. He has no rales. He exhibits no tenderness.  Abdominal: Soft. Bowel sounds are normal. He  exhibits no distension. There is no tenderness. There is no rebound and no guarding.  Genitourinary:  External anal exam reveals external hemorrhoid on the left side, no thrombosis, no infection, digital rectal exam reveals posterior anal mass. Anoscopy was then done demonstrating fungating mass involving 2 areas of the posterior anorectal region. One is 1 cm and one is approximately 1.5 cm.    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 We will excise these 2 masses. Additionally, in light of his symptoms, we will remove his external hemorrhoid. Procedure, risks, benefits were discussed in detail with the patient. He is agreeable. I discussed the expected postoperative course and the possible pathologic diagnoses as above.       Veronica Guerrant E 01/26/2014, 10:47 AM

## 2014-01-28 ENCOUNTER — Encounter (HOSPITAL_COMMUNITY): Payer: Self-pay | Admitting: Pharmacy Technician

## 2014-02-07 NOTE — Pre-Procedure Instructions (Signed)
Era Bumpersyrae D Sauseda  02/07/2014   Your procedure is scheduled on:  02/14/14  [facility]  2 * 3 at 11 AM.  Call this number if you have problems the morning of surgery: [phone]   Remember:   Do not eat food or drink liquids after midnight.   Take these medicines the morning of surgery with A SIP OF WATER: norvasc   Do not wear jewelry, make-up or nail polish.  Do not wear lotions, powders, or perfumes. You may wear deodorant.  Do not shave 48 hours prior to surgery. Men may shave face and neck.  Do not bring valuables to the hospital.  [facility] is not responsible                  for any belongings or valuables.               Contacts, dentures or bridgework may not be worn into surgery.  Leave suitcase in the car. After surgery it may be brought to your room.  For patients admitted to the hospital, discharge time is determined by your                treatment team.               Patients discharged the day of surgery will not be allowed to drive  home.  Name and phone number of your driver: family  Special Instructions: Shower using CHG 2 nights before surgery and the night before surgery.  If you shower the day of surgery use CHG.  Use special wash - you have one bottle of CHG for all showers.  You should use approximately 1/3 of the bottle for each shower.   Please read over the following fact sheets that you were given: Pain Booklet, Coughing and Deep Breathing and Surgical Site Infection Prevention

## 2014-02-08 ENCOUNTER — Encounter (HOSPITAL_COMMUNITY)
Admission: RE | Admit: 2014-02-08 | Discharge: 2014-02-08 | Disposition: A | Discharge status: Home / Self Care | Payer: No Typology Code available for payment source | Source: Ambulatory Visit | Attending: General Surgery | Admitting: General Surgery

## 2014-02-08 ENCOUNTER — Ambulatory Visit (HOSPITAL_COMMUNITY)
Admission: RE | Admit: 2014-02-08 | Discharge: 2014-02-08 | Disposition: A | Discharge status: Home / Self Care | Payer: No Typology Code available for payment source | Source: Ambulatory Visit | Attending: Anesthesiology | Admitting: Anesthesiology

## 2014-02-08 ENCOUNTER — Encounter (INDEPENDENT_AMBULATORY_CARE_PROVIDER_SITE_OTHER): Payer: Self-pay

## 2014-02-08 ENCOUNTER — Encounter (HOSPITAL_COMMUNITY): Payer: Self-pay

## 2014-02-08 DIAGNOSIS — Z01810 Encounter for preprocedural cardiovascular examination: Secondary | ICD-10-CM | POA: Insufficient documentation

## 2014-02-08 DIAGNOSIS — Z01818 Encounter for other preprocedural examination: Secondary | ICD-10-CM | POA: Insufficient documentation

## 2014-02-08 DIAGNOSIS — Z01812 Encounter for preprocedural laboratory examination: Secondary | ICD-10-CM | POA: Insufficient documentation

## 2014-02-08 HISTORY — DX: Mental disorder, not otherwise specified: F99

## 2014-02-08 LAB — BASIC METABOLIC PANEL
BUN: 10 mg/dL (ref 6–23)
CALCIUM: 9.2 mg/dL (ref 8.4–10.5)
CO2: 23 mEq/L (ref 19–32)
Chloride: 100 mEq/L (ref 96–112)
Creatinine, Ser: 0.78 mg/dL (ref 0.50–1.35)
GFR calc Af Amer: >90 mL/min (ref >90)
GLUCOSE: 90 mg/dL (ref 70–99)
Potassium: 6.2 mEq/L — ABNORMAL HIGH (ref 3.7–5.3)
SODIUM: 136 meq/L — AB (ref 137–147)

## 2014-02-08 LAB — CBC
HCT: 43 % (ref 39.0–52.0)
HEMOGLOBIN: 14.6 g/dL (ref 13.0–17.0)
MCH: 27.8 pg (ref 26.0–34.0)
MCHC: 34 g/dL (ref 30.0–36.0)
MCV: 81.9 fL (ref 78.0–100.0)
Platelets: 421 10*3/uL — ABNORMAL HIGH (ref 150–400)
RBC: 5.25 MIL/uL (ref 4.22–5.81)
RDW: 13.9 % (ref 11.5–15.5)
WBC: 7.3 10*3/uL (ref 4.0–10.5)

## 2014-02-08 NOTE — Progress Notes (Addendum)
Patient stated he was tested for OSA over at [facility] in 2011 and was unable to afford the equipment, and is hoping to get retested at the end of the month. He has not picked up his new blood pressure medications yet, and i urged him to get those and get started as soon as he can.  Initial BP  Was 186/123 upon arrival to PAT.     Spoke with Dr. Janee Mornhompson regarding potassium result.  DA

## 2014-02-13 MED ORDER — METRONIDAZOLE IN NACL 5-0.79 MG/ML-% IV SOLN
500.0000 mg | INTRAVENOUS | Status: AC
  Administered 2014-02-14: .5 g via INTRAVENOUS
  Filled 2014-02-13: qty 100

## 2014-02-13 MED ORDER — CIPROFLOXACIN IN D5W 400 MG/200ML IV SOLN
400.0000 mg | INTRAVENOUS | Status: AC
  Administered 2014-02-14: 400 mg via INTRAVENOUS
  Filled 2014-02-13: qty 200

## 2014-02-14 ENCOUNTER — Encounter (HOSPITAL_COMMUNITY): Payer: No Typology Code available for payment source | Admitting: Certified Registered Nurse Anesthetist

## 2014-02-14 ENCOUNTER — Encounter (HOSPITAL_COMMUNITY): Payer: Self-pay | Admitting: *Deleted

## 2014-02-14 ENCOUNTER — Encounter (HOSPITAL_COMMUNITY)
Admission: RE | Disposition: A | Discharge status: Home / Self Care | Payer: Self-pay | Source: Ambulatory Visit | Attending: General Surgery

## 2014-02-14 ENCOUNTER — Observation Stay (HOSPITAL_COMMUNITY)
Admission: RE | Admit: 2014-02-14 | Discharge: 2014-02-15 | Disposition: A | Discharge status: Home / Self Care | Payer: No Typology Code available for payment source | Source: Ambulatory Visit | Attending: General Surgery | Admitting: General Surgery

## 2014-02-14 ENCOUNTER — Ambulatory Visit (HOSPITAL_COMMUNITY): Payer: No Typology Code available for payment source | Admitting: Certified Registered Nurse Anesthetist

## 2014-02-14 DIAGNOSIS — A630 Anogenital (venereal) warts: Secondary | ICD-10-CM

## 2014-02-14 DIAGNOSIS — K6289 Other specified diseases of anus and rectum: Secondary | ICD-10-CM

## 2014-02-14 DIAGNOSIS — Z87891 Personal history of nicotine dependence: Secondary | ICD-10-CM | POA: Insufficient documentation

## 2014-02-14 DIAGNOSIS — G4730 Sleep apnea, unspecified: Secondary | ICD-10-CM | POA: Diagnosis present

## 2014-02-14 DIAGNOSIS — J45909 Unspecified asthma, uncomplicated: Secondary | ICD-10-CM | POA: Insufficient documentation

## 2014-02-14 DIAGNOSIS — I10 Essential (primary) hypertension: Secondary | ICD-10-CM | POA: Insufficient documentation

## 2014-02-14 DIAGNOSIS — G40909 Epilepsy, unspecified, not intractable, without status epilepticus: Secondary | ICD-10-CM | POA: Insufficient documentation

## 2014-02-14 HISTORY — PX: EXAMINATION UNDER ANESTHESIA: SHX1540

## 2014-02-14 HISTORY — PX: HEMORRHOID SURGERY: SHX153

## 2014-02-14 LAB — POTASSIUM: Potassium: 4.2 mEq/L (ref 3.7–5.3)

## 2014-02-14 SURGERY — HEMORRHOIDECTOMY
Anesthesia: General

## 2014-02-14 MED ORDER — NEOSTIGMINE METHYLSULFATE 1 MG/ML IJ SOLN
INTRAMUSCULAR | Status: DC | PRN
  Administered 2014-02-14: 5 mg via INTRAVENOUS

## 2014-02-14 MED ORDER — IPRATROPIUM-ALBUTEROL 0.5-2.5 (3) MG/3ML IN SOLN
3.0000 mL | Freq: Four times a day (QID) | RESPIRATORY_TRACT | Status: DC
  Administered 2014-02-14: 3 mL via RESPIRATORY_TRACT
  Filled 2014-02-14: qty 3

## 2014-02-14 MED ORDER — ONDANSETRON HCL 4 MG/2ML IJ SOLN: 4.0000 mg | Freq: Once | INTRAMUSCULAR | Status: DC | PRN

## 2014-02-14 MED ORDER — AMLODIPINE BESYLATE 5 MG PO TABS
5.0000 mg | ORAL_TABLET | Freq: Every day | ORAL | Status: DC
  Administered 2014-02-15: 5 mg via ORAL
  Filled 2014-02-14: qty 1

## 2014-02-14 MED ORDER — CHLORHEXIDINE GLUCONATE 4 % EX LIQD
1.0000 "application " | Freq: Once | CUTANEOUS | Status: DC
  Filled 2014-02-14: qty 15

## 2014-02-14 MED ORDER — KETOROLAC TROMETHAMINE 30 MG/ML IJ SOLN
INTRAMUSCULAR | Status: AC
  Filled 2014-02-14: qty 1

## 2014-02-14 MED ORDER — OXYCODONE-ACETAMINOPHEN 5-325 MG PO TABS
ORAL_TABLET | ORAL | Status: AC
  Administered 2014-02-14: 2 via ORAL
  Filled 2014-02-14: qty 2

## 2014-02-14 MED ORDER — ONDANSETRON HCL 4 MG/2ML IJ SOLN: 4.0000 mg | Freq: Four times a day (QID) | INTRAMUSCULAR | Status: DC | PRN

## 2014-02-14 MED ORDER — ROCURONIUM BROMIDE 100 MG/10ML IV SOLN
INTRAVENOUS | Status: DC | PRN
  Administered 2014-02-14: 10 mg via INTRAVENOUS
  Administered 2014-02-14: 30 mg via INTRAVENOUS

## 2014-02-14 MED ORDER — LACTATED RINGERS IV SOLN
INTRAVENOUS | Status: DC | PRN
  Administered 2014-02-14 (×2): via INTRAVENOUS

## 2014-02-14 MED ORDER — KCL IN DEXTROSE-NACL 20-5-0.45 MEQ/L-%-% IV SOLN
INTRAVENOUS | Status: DC
  Administered 2014-02-14: 18:00:00 via INTRAVENOUS
  Filled 2014-02-14 (×3): qty 1000

## 2014-02-14 MED ORDER — HYDROMORPHONE HCL PF 1 MG/ML IJ SOLN: 0.5000 mg | INTRAMUSCULAR | Status: DC | PRN

## 2014-02-14 MED ORDER — HYDROMORPHONE HCL PF 1 MG/ML IJ SOLN
0.2500 mg | INTRAMUSCULAR | Status: DC | PRN
  Administered 2014-02-14 (×3): 0.25 mg via INTRAVENOUS

## 2014-02-14 MED ORDER — ONDANSETRON HCL 4 MG/2ML IJ SOLN
INTRAMUSCULAR | Status: DC | PRN
  Administered 2014-02-14: 4 mg via INTRAVENOUS

## 2014-02-14 MED ORDER — FENTANYL CITRATE 0.05 MG/ML IJ SOLN
INTRAMUSCULAR | Status: DC | PRN
  Administered 2014-02-14 (×2): 100 ug via INTRAVENOUS
  Administered 2014-02-14: 50 ug via INTRAVENOUS

## 2014-02-14 MED ORDER — HYDROCHLOROTHIAZIDE 25 MG PO TABS
25.0000 mg | ORAL_TABLET | Freq: Every day | ORAL | Status: DC
  Administered 2014-02-14 – 2014-02-15 (×2): 25 mg via ORAL
  Filled 2014-02-14 (×3): qty 1

## 2014-02-14 MED ORDER — LIDOCAINE HCL (CARDIAC) 20 MG/ML IV SOLN
INTRAVENOUS | Status: DC | PRN
  Administered 2014-02-14: 80 mg via INTRAVENOUS

## 2014-02-14 MED ORDER — BUPIVACAINE LIPOSOME 1.3 % IJ SUSP
INTRAMUSCULAR | Status: DC | PRN
  Administered 2014-02-14: 20 mL

## 2014-02-14 MED ORDER — PROPOFOL 10 MG/ML IV BOLUS
INTRAVENOUS | Status: DC | PRN
  Administered 2014-02-14: 250 mg via INTRAVENOUS

## 2014-02-14 MED ORDER — BUPIVACAINE LIPOSOME 1.3 % IJ SUSP
20.0000 mL | INTRAMUSCULAR | Status: DC
  Filled 2014-02-14: qty 20

## 2014-02-14 MED ORDER — MIDAZOLAM HCL 5 MG/5ML IJ SOLN
INTRAMUSCULAR | Status: DC | PRN
  Administered 2014-02-14: 2 mg via INTRAVENOUS

## 2014-02-14 MED ORDER — GLYCOPYRROLATE 0.2 MG/ML IJ SOLN
INTRAMUSCULAR | Status: DC | PRN
  Administered 2014-02-14: 0.6 mg via INTRAVENOUS

## 2014-02-14 MED ORDER — ARTIFICIAL TEARS OP OINT
TOPICAL_OINTMENT | OPHTHALMIC | Status: DC | PRN
  Administered 2014-02-14: 1 via OPHTHALMIC

## 2014-02-14 MED ORDER — ENOXAPARIN SODIUM 40 MG/0.4ML ~~LOC~~ SOLN
40.0000 mg | SUBCUTANEOUS | Status: DC
  Filled 2014-02-14: qty 0.4

## 2014-02-14 MED ORDER — KETOROLAC TROMETHAMINE 30 MG/ML IJ SOLN
15.0000 mg | Freq: Once | INTRAMUSCULAR | Status: AC | PRN
  Administered 2014-02-14: 30 mg via INTRAVENOUS

## 2014-02-14 MED ORDER — LISINOPRIL 10 MG PO TABS
10.0000 mg | ORAL_TABLET | ORAL | Status: DC
  Administered 2014-02-14: 10 mg via ORAL
  Filled 2014-02-14: qty 1

## 2014-02-14 MED ORDER — HEMOSTATIC AGENTS (NO CHARGE) OPTIME
TOPICAL | Status: DC | PRN
  Administered 2014-02-14: 1 via TOPICAL

## 2014-02-14 MED ORDER — OXYCODONE-ACETAMINOPHEN 5-325 MG PO TABS
1.0000 | ORAL_TABLET | ORAL | Status: DC | PRN
  Administered 2014-02-14 (×2): 2 via ORAL
  Filled 2014-02-14: qty 2

## 2014-02-14 MED ORDER — LACTATED RINGERS IV SOLN
INTRAVENOUS | Status: DC
  Administered 2014-02-14: 12:00:00 via INTRAVENOUS

## 2014-02-14 MED ORDER — ONDANSETRON HCL 4 MG PO TABS: 4.0000 mg | ORAL_TABLET | Freq: Four times a day (QID) | ORAL | Status: DC | PRN

## 2014-02-14 MED ORDER — SUCCINYLCHOLINE CHLORIDE 20 MG/ML IJ SOLN
INTRAMUSCULAR | Status: DC | PRN
  Administered 2014-02-14: 160 mg via INTRAVENOUS

## 2014-02-14 MED ORDER — DEXAMETHASONE SODIUM PHOSPHATE 10 MG/ML IJ SOLN
INTRAMUSCULAR | Status: DC | PRN
  Administered 2014-02-14: 10 mg via INTRAVENOUS

## 2014-02-14 MED ORDER — HYDROMORPHONE HCL PF 1 MG/ML IJ SOLN
INTRAMUSCULAR | Status: AC
  Administered 2014-02-14: 0.25 mg via INTRAVENOUS
  Filled 2014-02-14: qty 1

## 2014-02-14 SURGICAL SUPPLY — 42 items
BLADE SURG 15 STRL LF DISP TIS (BLADE) ×1 IMPLANT
BLADE SURG 15 STRL SS (BLADE) ×3
CANISTER SUCTION 2500CC (MISCELLANEOUS) ×3 IMPLANT
COVER MAYO STAND STRL (DRAPES) ×3 IMPLANT
COVER SURGICAL LIGHT HANDLE (MISCELLANEOUS) ×3 IMPLANT
DRAPE UTILITY 15X26 W/TAPE STR (DRAPE) ×6 IMPLANT
DRSG PAD ABDOMINAL 8X10 ST (GAUZE/BANDAGES/DRESSINGS) ×2 IMPLANT
ELECT CAUTERY BLADE 6.4 (BLADE) ×3 IMPLANT
ELECT REM PT RETURN 9FT ADLT (ELECTROSURGICAL) ×3
ELECTRODE REM PT RTRN 9FT ADLT (ELECTROSURGICAL) ×1 IMPLANT
GAUZE SPONGE 4X4 16PLY XRAY LF (GAUZE/BANDAGES/DRESSINGS) ×3 IMPLANT
GLOVE BIO SURGEON STRL SZ8 (GLOVE) ×3 IMPLANT
GLOVE BIOGEL PI IND STRL 8 (GLOVE) ×1 IMPLANT
GLOVE BIOGEL PI INDICATOR 8 (GLOVE) ×2
GOWN STRL REUS W/ TWL LRG LVL3 (GOWN DISPOSABLE) ×1 IMPLANT
GOWN STRL REUS W/ TWL XL LVL3 (GOWN DISPOSABLE) ×1 IMPLANT
GOWN STRL REUS W/TWL LRG LVL3 (GOWN DISPOSABLE) ×3
GOWN STRL REUS W/TWL XL LVL3 (GOWN DISPOSABLE) ×3
KIT BASIN OR (CUSTOM PROCEDURE TRAY) ×3 IMPLANT
KIT ROOM TURNOVER OR (KITS) ×3 IMPLANT
NEEDLE 22X1 1/2 (OR ONLY) (NEEDLE) ×3 IMPLANT
NS IRRIG 1000ML POUR BTL (IV SOLUTION) ×3 IMPLANT
PACK LITHOTOMY IV (CUSTOM PROCEDURE TRAY) ×3 IMPLANT
PAD ARMBOARD 7.5X6 YLW CONV (MISCELLANEOUS) ×6 IMPLANT
PENCIL BUTTON HOLSTER BLD 10FT (ELECTRODE) ×3 IMPLANT
SPECIMEN JAR SMALL (MISCELLANEOUS) ×3 IMPLANT
SPONGE GAUZE 4X4 12PLY (GAUZE/BANDAGES/DRESSINGS) ×3 IMPLANT
SPONGE GAUZE 4X4 12PLY STER LF (GAUZE/BANDAGES/DRESSINGS) ×2 IMPLANT
SPONGE HEMORRHOID 8X3CM (HEMOSTASIS) ×2 IMPLANT
SPONGE SURGIFOAM ABS GEL 100 (HEMOSTASIS) IMPLANT
SURGILUBE 2OZ TUBE FLIPTOP (MISCELLANEOUS) ×3 IMPLANT
SUT CHROMIC 2 0 SH (SUTURE) ×3 IMPLANT
SUT CHROMIC 3 0 SH 27 (SUTURE) ×3 IMPLANT
SYR CONTROL 10ML LL (SYRINGE) ×3 IMPLANT
TAPE CLOTH SURG 6X10 WHT LF (GAUZE/BANDAGES/DRESSINGS) ×2 IMPLANT
TOWEL OR 17X24 6PK STRL BLUE (TOWEL DISPOSABLE) ×6 IMPLANT
TRAY PROCTOSCOPIC FIBER OPTIC (SET/KITS/TRAYS/PACK) IMPLANT
TUBE CONNECTING 12'X1/4 (SUCTIONS) ×1
TUBE CONNECTING 12X1/4 (SUCTIONS) ×2 IMPLANT
UNDERPAD 30X30 INCONTINENT (UNDERPADS AND DIAPERS) ×3 IMPLANT
WATER STERILE IRR 1000ML POUR (IV SOLUTION) ×3 IMPLANT
YANKAUER SUCT BULB TIP NO VENT (SUCTIONS) ×3 IMPLANT

## 2014-02-14 NOTE — H&P (View-Only) (Signed)
Patient ID: Andrew Larson, male   DOB: 11/21/1991, 22 y.o.   MRN: [id number]  Chief Complaint  Patient presents with  . New Evaluation    eval int/ext hems    HPI Andrew Larson is a 22 y.o. male.  Chief complaint: Anal pain, hemorrhoids HPI Patient has a several month history of anal pain. He has had a small amount of bleeding. Pain is exacerbated by bowel movements. He is followed by the family practice clinic at [place], Dr.Merrell. Dr. Merrell asked me to see him in consultation regarding possible hemorrhoidal disease. He has tried Preparation H both cream and wipes without much relief.  Past Medical History  Diagnosis Date  . Hypertension   . Seizures   . Sleep apnea   . Asthma     History reviewed. No pertinent past surgical history.  Family History  Problem Relation Age of Onset  . Hypertension Mother   . Diabetes Mother   . Hypertension Father     Social History History  Substance Use Topics  . Smoking status: Former Smoker  . Smokeless tobacco: Never Used  . Alcohol Use: No  History of anal sex in the past, not current  No Known Allergies  Current Outpatient Prescriptions  Medication Sig Dispense Refill  . albuterol (PROVENTIL HFA) 108 (90 BASE) MCG/ACT inhaler Inhale 2 puffs into the lungs every 4 (four) hours as needed.  1 Inhaler  3  . amLODipine (NORVASC) 5 MG tablet Take 1 tablet (5 mg total) by mouth daily.  30 tablet  6  . hydrochlorothiazide (HYDRODIURIL) 25 MG tablet Take 1 tablet (25 mg total) by mouth daily.  90 tablet  3  . ibuprofen (ADVIL,MOTRIN) 200 MG tablet Take 800 mg by mouth every 8 (eight) hours as needed. For pain.      . lisinopril (PRINIVIL,ZESTRIL) 10 MG tablet Take 1 tablet (10 mg total) by mouth daily.  90 tablet  3   No current facility-administered medications for this visit.    Review of Systems Review of Systems  Constitutional: Negative for fever, chills and unexpected weight change.  HENT: Negative for congestion,  hearing loss, sore throat, trouble swallowing and voice change.   Eyes: Negative for visual disturbance.  Respiratory: Negative for cough and wheezing.   Cardiovascular: Negative for chest pain, palpitations and leg swelling.  Gastrointestinal: Positive for blood in stool, anal bleeding and rectal pain. Negative for nausea, vomiting, abdominal pain, diarrhea, constipation and abdominal distention.       See history of present illness  Genitourinary: Negative for hematuria and difficulty urinating.  Musculoskeletal: Negative for arthralgias.  Skin: Negative for rash and wound.  Neurological: Negative for seizures, syncope, weakness and headaches.  Hematological: Negative for adenopathy. Does not bruise/bleed easily.  Psychiatric/Behavioral: Negative for confusion.    Blood pressure 160/120, pulse 84, temperature 97.9 F (36.6 C), temperature source Oral, resp. rate 16, height 6' 1" (1.854 m), weight 393 lb (178.264 kg).  Physical Exam Physical Exam  Constitutional: He appears well-developed and well-nourished.  HENT:  Head: Normocephalic and atraumatic.  Eyes: EOM are normal. Pupils are equal, round, and reactive to light. No scleral icterus.  Neck: Normal range of motion. Neck supple. No tracheal deviation present.  Cardiovascular: Normal rate, normal heart sounds and intact distal pulses.   Pulmonary/Chest: Effort normal and breath sounds normal. No stridor. No respiratory distress. He has no wheezes. He has no rales. He exhibits no tenderness.  Abdominal: Soft. Bowel sounds are normal. He   exhibits no distension. There is no tenderness. There is no rebound and no guarding.  Genitourinary:  External anal exam reveals external hemorrhoid on the left side, no thrombosis, no infection, digital rectal exam reveals posterior anal mass. Anoscopy was then done demonstrating fungating mass involving 2 areas of the posterior anorectal region. One is 1 cm and one is approximately 1.5 cm.    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 We will excise these 2 masses. Additionally, in light of his symptoms, we will remove his external hemorrhoid. Procedure, risks, benefits were discussed in detail with the patient. He is agreeable. I discussed the expected postoperative course and the possible pathologic diagnoses as above.       Majid Mccravy E 01/26/2014, 10:47 AM    

## 2014-02-14 NOTE — Anesthesia Preprocedure Evaluation (Signed)
Anesthesia Evaluation  Patient identified by MRN, date of birth, ID band Patient awake    Reviewed: Allergy & Precautions, H&P , NPO status , Patient's Chart, lab work & pertinent test results  Airway Mallampati: II TM Distance: >3 FB Neck ROM: Full    Dental  (+) Teeth Intact, Dental Advisory Given   Pulmonary  breath sounds clear to auscultation        Cardiovascular hypertension, Rhythm:Regular Rate:Normal     Neuro/Psych    GI/Hepatic   Endo/Other    Renal/GU      Musculoskeletal   Abdominal   Peds  Hematology   Anesthesia Other Findings   Reproductive/Obstetrics                           Anesthesia Physical Anesthesia Plan  ASA: III  Anesthesia Plan: General   Post-op Pain Management:    Induction: Intravenous  Airway Management Planned: Oral ETT  Additional Equipment:   Intra-op Plan:   Post-operative Plan: Extubation in OR  Informed Consent: I have reviewed the patients History and Physical, chart, labs and discussed the procedure including the risks, benefits and alternatives for the proposed anesthesia with the patient or authorized representative who has indicated his/her understanding and acceptance.   Dental advisory given  Plan Discussed with: CRNA and Anesthesiologist  Anesthesia Plan Comments: (htn Morbid obesity H/O sleep apnea not on CPAP  Andrew Broodavid Aaro Meyers, MD)        Anesthesia Quick Evaluation

## 2014-02-14 NOTE — Anesthesia Postprocedure Evaluation (Signed)
  Anesthesia Post-op Note  Patient: Andrew Larson  Procedure(s) Performed: Procedure(s):  HEMORRHOIDECTOMY/RECTAL BIOPSY (N/A) EXAM UNDER ANESTHESIA (N/A)  Patient Location: PACU  Anesthesia Type:General  Level of Consciousness: awake, alert  and oriented  Airway and Oxygen Therapy: Patient Spontanous Breathing  Post-op Pain: mild  Post-op Assessment: Post-op Vital signs reviewed, Patient's Cardiovascular Status Stable, Respiratory Function Stable, Patent Airway, No signs of Nausea or vomiting and Pain level controlled  Post-op Vital Signs: stable  Complications: No apparent anesthesia complications

## 2014-02-14 NOTE — Interval H&P Note (Signed)
History and Physical Interval Note:  02/14/2014 12:39 PM  Andrew Larson  has presented today for surgery, with the diagnosis of rectal mass, excison exterior hemorrhoid  The various methods of treatment have been discussed with the patient and family. After consideration of risks, benefits and other options for treatment, the patient has consented to  Procedure(s):  HEMORRHOIDECTOMY/RECTAL BIOPSY (N/A) EXAM UNDER ANESTHESIA (N/A) as a surgical intervention .  The patient's history has been reviewed, patient re-examined, no change in status, stable for surgery.  I have reviewed the patient's chart and labs.  Questions were answered to the patient's satisfaction.     Chanelle Hodsdon E

## 2014-02-14 NOTE — Transfer of Care (Signed)
Immediate Anesthesia Transfer of Care Note  Patient: Andrew Larson  Procedure(s) Performed: Procedure(s):  HEMORRHOIDECTOMY/RECTAL BIOPSY (N/A) EXAM UNDER ANESTHESIA (N/A)  Patient Location: PACU  Anesthesia Type:General  Level of Consciousness: awake, alert , oriented and patient cooperative  Airway & Oxygen Therapy: Patient Spontanous Breathing and Patient connected to face mask oxygen  Post-op Assessment: Report given to PACU RN, Post -op Vital signs reviewed and stable and Patient moving all extremities X 4  Post vital signs: Reviewed and stable  Complications: No apparent anesthesia complications

## 2014-02-14 NOTE — Preoperative (Signed)
Beta Blockers   Reason not to administer Beta Blockers:Not Applicable 

## 2014-02-14 NOTE — Op Note (Signed)
02/14/2014  1:56 PM  PATIENT:  Andrew Larson  23 y.o. male  PRE-OPERATIVE DIAGNOSIS:  Anal mass, external hemorrhoid  POST-OPERATIVE DIAGNOSIS:  Anal mass, perianal condylomata  PROCEDURE:  Procedure(s): EXAM UNDER ANESTHESIA RECTAL BIOPSY EXCISION MULTIPLE PERIANAL CONDYLOMATA   SURGEON:  Surgeon(s): Liz MaladyBurke E Fayth Trefry, MD  ASSISTANTS: none   ANESTHESIA:   local and general  EBL:     BLOOD ADMINISTERED:none  DRAINS: none   SPECIMEN:  Excision  DISPOSITION OF SPECIMEN:  PATHOLOGY  COUNTS:  YES  DICTATION: .Dragon Dictation  Patient presents for examination under anesthesia, excision of anal mass, and external hemorrhoidectomy. He was notified in the preop holding area. Informed consent was obtained. He received intravenous antibiotic. Was brought to the operating room. General endotracheal anesthesia was administered by the anesthesia staff. He was placed in lithotomy position. Perianal region was prepped and draped in sterile fashion. We did time out procedure. Exparel was injected in the perianal region for postoperative pain relief. 20 cc total. Examination under anesthesia revealed multiple perianal and intra-anal condylomata. The lesion that was felt to be an external hemorrhoid appeared more like a large exophytic condyloma with 2 lobes. This mass was excised using harmonic scalpel. Cautery was used for hemostasis. Multiple other small condylomata were excised. Tiny ones were cauterized. This was all sent as one pathology specimen. Next further inspection with rectal retractors revealed a suspicious lesion at approximately 8:00 in lithotomy. This was excised and sent as a separate and a biopsy. The mucosal defect was closed with running 2-0 chromic. Several other small warts were cauterized. No other significant endorectal mass was seen. At this time the anoderm defect from the initial large mass excision was closed with running 3-0 chromic. There was good hemostasis. No  further residual disease was noted. Rectal Gelfoam pack was placed. Sterile dressings were applied. All counts were correct. Patient tolerated procedure well without apparent complication was taken recovery in stable condition.  PATIENT DISPOSITION:  PACU - hemodynamically stable.   Delay start of Pharmacological VTE agent (>24hrs) due to surgical blood loss or risk of bleeding:  no  Violeta GelinasBurke Lilymae Swiech, MD, MPH, FACS Pager: [phone]  3/16/20151:56 PM

## 2014-02-15 ENCOUNTER — Telehealth (INDEPENDENT_AMBULATORY_CARE_PROVIDER_SITE_OTHER): Payer: Self-pay | Admitting: General Surgery

## 2014-02-15 ENCOUNTER — Encounter (HOSPITAL_COMMUNITY): Payer: Self-pay | Admitting: General Surgery

## 2014-02-15 ENCOUNTER — Telehealth (INDEPENDENT_AMBULATORY_CARE_PROVIDER_SITE_OTHER): Payer: Self-pay

## 2014-02-15 MED ORDER — OXYCODONE-ACETAMINOPHEN 5-325 MG PO TABS: 1.0000 | ORAL_TABLET | Freq: Four times a day (QID) | ORAL | Status: DC | PRN

## 2014-02-15 MED ORDER — WHITE PETROLATUM GEL
Status: AC
  Filled 2014-02-15: qty 5

## 2014-02-15 MED ORDER — IPRATROPIUM-ALBUTEROL 0.5-2.5 (3) MG/3ML IN SOLN: 3.0000 mL | Freq: Four times a day (QID) | RESPIRATORY_TRACT | Status: DC | PRN

## 2014-02-15 MED ORDER — IPRATROPIUM-ALBUTEROL 18-103 MCG/ACT IN AERO: 2.0000 | INHALATION_SPRAY | Freq: Four times a day (QID) | RESPIRATORY_TRACT | Status: DC | PRN

## 2014-02-15 NOTE — Telephone Encounter (Signed)
error 

## 2014-02-15 NOTE — Progress Notes (Signed)
Again pt. states he is aware of his sleep apnea chooses not to wear 02 or continous pulse ox

## 2014-02-15 NOTE — Discharge Summary (Signed)
Physician Discharge Summary  Patient ID: Andrew Larson MRN: [id number] DOB/AGE: [birth year] 23 y.o.  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 He underwent examination under anesthesia, excision of external anal condyloma, anal biopsy. He has significant asthma which has not been treated secondary to inability to fill his prescription financially. He was observed overnight on recommendation of anesthesia. He did very well. Breathing treatments helped him significantly. He had no bleeding from his wound he had good pain control. He tolerated his diet and is discharged on postop day 1.  Consults: None  Significant Diagnostic Studies: none  Treatments: surgery: above  Discharge Exam: Blood pressure 153/84, pulse 106, temperature 98.1 F (36.7 C), temperature source Oral, resp. rate 26, SpO2 95.00%. General appearance: alert and cooperative Resp: clear to auscultation bilaterally Cardio: regular rate and rhythm GI: soft, nontender Wound with dressing clean dry and intact, no bleeding  Disposition: 01-Home or Self Care  Discharge Orders   Future Appointments Provider Department Dept Phone   02/21/2014 11:00 AM Ozella Rocksavid J Merrell, MD [facility] [phone]   02/27/2014 8:00 PM [facility] [phone]   03/02/2014 9:10 AM Liz MaladyBurke E Aslee Such, MD [facility] [place] [phone]   Future Orders Complete By Expires   Diet - low sodium heart healthy  As directed    Discharge instructions  As directed    Comments:     You may pass a sponge with her first bowel movement. His bowel movements are difficult, you may take over-the-counter docusate 100mg  twice a day. I will call you when you're pathology results  come back.   Discharge wound care:  As directed    Comments:     Place a gauze in your underwear daily, you may have some spotting   Increase activity slowly  As directed        Medication List         albuterol-ipratropium 18-103 MCG/ACT inhaler  Commonly known as:  COMBIVENT  Inhale 2 puffs into the lungs every 6 (six) hours as needed for wheezing or shortness of breath.     amLODipine 5 MG tablet  Commonly known as:  NORVASC  Take 1 tablet (5 mg total) by mouth daily.     hydrochlorothiazide 25 MG tablet  Commonly known as:  HYDRODIURIL  Take 1 tablet (25 mg total) by mouth daily.     lisinopril 10 MG tablet  Commonly known as:  PRINIVIL,ZESTRIL  Take 10 mg by mouth every other day.     oxyCODONE-acetaminophen 5-325 MG per tablet  Commonly known as:  PERCOCET/ROXICET  Take 1-2 tablets by mouth every 6 (six) hours as needed (pain).           Follow-up Information   [facility], [facility]. Schedule an appointment as soon as possible for a visit in 3 weeks. (my nurse will call you tomorrow to make the appointment)    Specialty:  General Surgery   Contact information:   [address] [place] [address] [phone]       Signed: Liz MaladyHOMPSON,Deshannon Seide E 02/15/2014, 7:52 AM

## 2014-02-15 NOTE — Telephone Encounter (Signed)
Gave him pathology results

## 2014-02-15 NOTE — Progress Notes (Signed)
Spoke with patient regarding wearing CPAP at bedtime for sleep apnea.  Patient states he does not wear CPAP at home.  He states he had a sleep study in 2011 but did not get the machine.  Patient states he has another sleep study scheduled in the next couple of weeks.  Patient refused to wear CPAP at this time.  Encouraged patient to call for respiratory if he would like to be set up on CPAP.

## 2014-02-15 NOTE — Progress Notes (Signed)
Discharged patient with instructions.

## 2014-02-15 NOTE — Addendum Note (Signed)
Addendum created 02/15/14 0847 by Edmonia CaprioAmanda M Meganne Rita, CRNA   Modules edited: Anesthesia Medication Administration

## 2014-02-21 ENCOUNTER — Ambulatory Visit (INDEPENDENT_AMBULATORY_CARE_PROVIDER_SITE_OTHER): Payer: No Typology Code available for payment source | Admitting: Family Medicine

## 2014-02-21 ENCOUNTER — Telehealth (INDEPENDENT_AMBULATORY_CARE_PROVIDER_SITE_OTHER): Payer: Self-pay | Admitting: *Deleted

## 2014-02-21 ENCOUNTER — Encounter: Payer: Self-pay | Admitting: Family Medicine

## 2014-02-21 VITALS — BP 149/100 | HR 82 | Temp 98.0°F | Ht 73.0 in | Wt 394.5 lb

## 2014-02-21 DIAGNOSIS — H6090 Unspecified otitis externa, unspecified ear: Secondary | ICD-10-CM | POA: Insufficient documentation

## 2014-02-21 DIAGNOSIS — K644 Residual hemorrhoidal skin tags: Secondary | ICD-10-CM

## 2014-02-21 DIAGNOSIS — H60399 Other infective otitis externa, unspecified ear: Secondary | ICD-10-CM

## 2014-02-21 DIAGNOSIS — E669 Obesity, unspecified: Secondary | ICD-10-CM

## 2014-02-21 DIAGNOSIS — I10 Essential (primary) hypertension: Secondary | ICD-10-CM

## 2014-02-21 DIAGNOSIS — K6289 Other specified diseases of anus and rectum: Secondary | ICD-10-CM

## 2014-02-21 DIAGNOSIS — G4733 Obstructive sleep apnea (adult) (pediatric): Secondary | ICD-10-CM

## 2014-02-21 DIAGNOSIS — R198 Other specified symptoms and signs involving the digestive system and abdomen: Secondary | ICD-10-CM

## 2014-02-21 MED ORDER — LISINOPRIL 10 MG PO TABS: 10.0000 mg | ORAL_TABLET | ORAL | Status: DC

## 2014-02-21 MED ORDER — ANTIPYRINE-BENZOCAINE 5.4-1.4 % OT SOLN: 3.0000 [drp] | OTIC | Status: DC | PRN

## 2014-02-21 MED ORDER — HYDROCHLOROTHIAZIDE 25 MG PO TABS: 25.0000 mg | ORAL_TABLET | Freq: Every day | ORAL | Status: DC

## 2014-02-21 MED ORDER — AMLODIPINE BESYLATE 5 MG PO TABS: 5.0000 mg | ORAL_TABLET | Freq: Every day | ORAL | Status: DC

## 2014-02-21 NOTE — Assessment & Plan Note (Signed)
Not controlled Not taking meds as prescribed.  Provided further instructions today and new Rx to take to HD

## 2014-02-21 NOTE — Telephone Encounter (Signed)
He may have a refill. Can someone there write it? I am the DOW - [facility].

## 2014-02-21 NOTE — Assessment & Plan Note (Signed)
Meet w/ Dr. Gerilyn PilgrimSykes Pt to call for appt Very motivated to lose wt

## 2014-02-21 NOTE — Telephone Encounter (Signed)
Prescription written out and awaiting signature from urgent office MD this afternoon.

## 2014-02-21 NOTE — Telephone Encounter (Signed)
Patient called to ask for a refill of medication.  Patient states pain at surgical site.  Patient had a hemorrhoidectomy on 02/14/14.  Patient denies any signs or symptoms of infection.  Explained that I would send a message to Dr. Janee Mornhompson for approval then we will let him know once a decision has been made.  Patient states understanding and agreeable at this time.

## 2014-02-21 NOTE — Telephone Encounter (Signed)
Attempted to call patient to make aware his RX is ready for pick up at the front desk.  Unable to leave a message, no voice mail available.

## 2014-02-21 NOTE — Assessment & Plan Note (Signed)
Excised by Gen Surg.  Minimal spotting now.  Continue daily soft BM and Gen Surg f/u

## 2014-02-21 NOTE — Assessment & Plan Note (Signed)
No sign of overt infection Irritation likely from trauma from Qtip Counseled on using H2O2 for dissolving wax and using auralgan for pain relief.

## 2014-02-21 NOTE — Progress Notes (Signed)
Andrew Larson is a 23 y.o. male who presents to [facility] today for HTN f/u  HTN: taking amlodipine and lisinopril. Not taking HCTZ. Did not take the amlodipine this am. Denies any CP, palpitations, SOB, HA.   Hemorrhoids: minimal spotting on and off after hemorrhoidectomy and anal chondyloma excision.   Obesity: wt up 10lbs from 1 year ago. Walks 1-2 mile daily. Cheeseburger and fries yesterday for lunch. Had some fruit water. Just stopped drinking soda. FAmily members are not obese.   The following portions of the patient's history were reviewed and updated as appropriate: allergies, current medications, past medical history, family and social history, and problem list.  Patient is a nonsmoker.  Past Medical History  Diagnosis Date  . Hypertension   . Sleep apnea   . Asthma   . Mental disorder     bipolar  . Seizures     ?? age of 4-5    ROS as above otherwise neg.    Medications reviewed. Current Outpatient Prescriptions  Medication Sig Dispense Refill  . albuterol-ipratropium (COMBIVENT) 18-103 MCG/ACT inhaler Inhale 2 puffs into the lungs every 6 (six) hours as needed for wheezing or shortness of breath.  1 Inhaler  1  . amLODipine (NORVASC) 5 MG tablet Take 1 tablet (5 mg total) by mouth daily.  90 tablet  3  . antipyrine-benzocaine (AURALGAN) otic solution Place 3-4 drops into both ears every 2 (two) hours as needed for ear pain.  10 mL  0  . hydrochlorothiazide (HYDRODIURIL) 25 MG tablet Take 1 tablet (25 mg total) by mouth daily.  90 tablet  3  . lisinopril (PRINIVIL,ZESTRIL) 10 MG tablet Take 1 tablet (10 mg total) by mouth every other day.  90 tablet  3  . oxyCODONE-acetaminophen (PERCOCET/ROXICET) 5-325 MG per tablet Take 1-2 tablets by mouth every 6 (six) hours as needed (pain).  40 tablet  0   No current facility-administered medications for this visit.    Exam: BP 149/100  Pulse 82  Temp(Src) 98 F (36.7 C) (Oral)  Ht 6\' 1"  (1.854 m)  Wt 394 lb 8 oz (178.944 kg)   BMI 52.06 kg/m2 Gen: Well NAD HEENT: EOMI,  MMM, TM bilat nml. L External ear canal w/ injection but no purulence or skin breakdown.   No results found for this or any previous visit (from the past 72 hour(s)).  A/P (as seen in Problem list)  External hemorrhoid Excised by Gen Surg.  Minimal spotting now.  Continue daily soft BM and Gen Surg f/u  SLEEP APNEA, OBSTRUCTIVE, SEVERE Sleep study scheduled at the end of this month  HYPERTENSION, BENIGN SYSTEMIC Not controlled Not taking meds as prescribed.  Provided further instructions today and new Rx to take to HD  OBESITY, NOS Meet w/ Dr. 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

## 2014-02-21 NOTE — Assessment & Plan Note (Signed)
Sleep study scheduled at the end of this month

## 2014-02-21 NOTE — Telephone Encounter (Signed)
Error, opened two telephone messages

## 2014-02-21 NOTE — Patient Instructions (Signed)
You are doing well overall Remember to increase the amount of fiber in your diet to try and have daily bowel movements Please call and make an apopintment with Dr. Gerilyn PilgrimSykes to discuss nutrition and weight loss Please remember to take lisinopril, HCTZ and amlodipine for your blood pressure Please come back to see me in 4 weeks or sooner if needed.
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
## 2014-02-27 ENCOUNTER — Ambulatory Visit (HOSPITAL_BASED_OUTPATIENT_CLINIC_OR_DEPARTMENT_OTHER): Payer: No Typology Code available for payment source | Attending: Family Medicine

## 2014-03-02 ENCOUNTER — Encounter (INDEPENDENT_AMBULATORY_CARE_PROVIDER_SITE_OTHER): Payer: PRIVATE HEALTH INSURANCE | Admitting: General Surgery

## 2014-03-09 ENCOUNTER — Encounter (INDEPENDENT_AMBULATORY_CARE_PROVIDER_SITE_OTHER): Payer: PRIVATE HEALTH INSURANCE | Admitting: General Surgery

## 2014-03-10 ENCOUNTER — Encounter (INDEPENDENT_AMBULATORY_CARE_PROVIDER_SITE_OTHER): Payer: Self-pay | Admitting: General Surgery

## 2014-03-18 ENCOUNTER — Ambulatory Visit (INDEPENDENT_AMBULATORY_CARE_PROVIDER_SITE_OTHER): Payer: PRIVATE HEALTH INSURANCE | Admitting: General Surgery

## 2014-03-18 ENCOUNTER — Encounter (INDEPENDENT_AMBULATORY_CARE_PROVIDER_SITE_OTHER): Payer: Self-pay | Admitting: General Surgery

## 2014-03-18 VITALS — BP 170/102 | HR 80 | Temp 97.6°F | Resp 16 | Wt 393.0 lb

## 2014-03-18 DIAGNOSIS — A630 Anogenital (venereal) warts: Secondary | ICD-10-CM | POA: Insufficient documentation

## 2014-03-18 NOTE — Patient Instructions (Signed)
You may experience some bloody spotting with bowel movements for the next 4-6 weeks. If it persists longer than that, please give us a call

## 2014-03-18 NOTE — Progress Notes (Signed)
Subjective:     Patient ID: Andrew Larson, male   DOB: 06-Apr-1991, 23 y.o.   MRN: [id number]  HPI  Patient status post examination under anesthesia and excision of multiple intra-anal and perianal masses. Pathology revealed condyloma. No evidence of malignancy. He is doing well. No longer having any pain. Occasionally has some blood spotting with bowel movements. Review of Systems     Objective:   Physical Exam Perianal region has healed. No evidence of infection.    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 I asked him to discuss with his primary care physician if he is a candidate for the vaccine for HPV.

## 2014-03-22 ENCOUNTER — Ambulatory Visit: Payer: No Typology Code available for payment source | Admitting: Family Medicine

## 2014-03-27 ENCOUNTER — Emergency Department (HOSPITAL_COMMUNITY)
Admission: EM | Admit: 2014-03-27 | Discharge: 2014-03-27 | Disposition: A | Discharge status: Home / Self Care | Payer: No Typology Code available for payment source | Attending: Emergency Medicine | Admitting: Emergency Medicine

## 2014-03-27 DIAGNOSIS — Z8659 Personal history of other mental and behavioral disorders: Secondary | ICD-10-CM | POA: Insufficient documentation

## 2014-03-27 DIAGNOSIS — T148XXA Other injury of unspecified body region, initial encounter: Secondary | ICD-10-CM

## 2014-03-27 DIAGNOSIS — Y9389 Activity, other specified: Secondary | ICD-10-CM | POA: Insufficient documentation

## 2014-03-27 DIAGNOSIS — IMO0002 Reserved for concepts with insufficient information to code with codable children: Secondary | ICD-10-CM | POA: Insufficient documentation

## 2014-03-27 DIAGNOSIS — I10 Essential (primary) hypertension: Secondary | ICD-10-CM | POA: Insufficient documentation

## 2014-03-27 DIAGNOSIS — J45909 Unspecified asthma, uncomplicated: Secondary | ICD-10-CM | POA: Insufficient documentation

## 2014-03-27 DIAGNOSIS — Y9289 Other specified places as the place of occurrence of the external cause: Secondary | ICD-10-CM | POA: Insufficient documentation

## 2014-03-27 DIAGNOSIS — Z79899 Other long term (current) drug therapy: Secondary | ICD-10-CM | POA: Insufficient documentation

## 2014-03-27 DIAGNOSIS — Z8669 Personal history of other diseases of the nervous system and sense organs: Secondary | ICD-10-CM | POA: Insufficient documentation

## 2014-03-27 MED ORDER — CYCLOBENZAPRINE HCL 10 MG PO TABS
5.0000 mg | ORAL_TABLET | Freq: Once | ORAL | Status: AC
  Administered 2014-03-27: 5 mg via ORAL
  Filled 2014-03-27: qty 1

## 2014-03-27 MED ORDER — CYCLOBENZAPRINE HCL 5 MG PO TABS: 5.0000 mg | ORAL_TABLET | Freq: Three times a day (TID) | ORAL | Status: DC | PRN

## 2014-03-27 MED ORDER — IBUPROFEN 200 MG PO TABS
600.0000 mg | ORAL_TABLET | Freq: Once | ORAL | Status: AC
  Administered 2014-03-27: 600 mg via ORAL
  Filled 2014-03-27: qty 3

## 2014-03-27 MED ORDER — IBUPROFEN 600 MG PO TABS: 600.0000 mg | ORAL_TABLET | Freq: Three times a day (TID) | ORAL | Status: DC | PRN
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
## 2014-03-27 NOTE — ED Provider Notes (Signed)
Medical screening examination/treatment/procedure(s) were performed by non-physician practitioner and as supervising physician I was immediately available for consultation/collaboration.   EKG Interpretation None       Adolf Ormiston M Adonus Uselman, MD 03/27/14 0428 

## 2014-03-28 DIAGNOSIS — Z79899 Other long term (current) drug therapy: Secondary | ICD-10-CM | POA: Insufficient documentation

## 2014-03-28 DIAGNOSIS — R569 Unspecified convulsions: Secondary | ICD-10-CM | POA: Insufficient documentation

## 2014-03-28 DIAGNOSIS — Y939 Activity, unspecified: Secondary | ICD-10-CM | POA: Insufficient documentation

## 2014-03-28 DIAGNOSIS — F489 Nonpsychotic mental disorder, unspecified: Secondary | ICD-10-CM | POA: Insufficient documentation

## 2014-03-28 DIAGNOSIS — S0993XA Unspecified injury of face, initial encounter: Secondary | ICD-10-CM | POA: Insufficient documentation

## 2014-03-28 DIAGNOSIS — J45909 Unspecified asthma, uncomplicated: Secondary | ICD-10-CM | POA: Insufficient documentation

## 2014-03-28 DIAGNOSIS — S199XXA Unspecified injury of neck, initial encounter: Principal | ICD-10-CM

## 2014-03-28 DIAGNOSIS — I10 Essential (primary) hypertension: Secondary | ICD-10-CM | POA: Insufficient documentation

## 2014-03-28 DIAGNOSIS — G4730 Sleep apnea, unspecified: Secondary | ICD-10-CM | POA: Insufficient documentation

## 2014-03-29 ENCOUNTER — Ambulatory Visit (INDEPENDENT_AMBULATORY_CARE_PROVIDER_SITE_OTHER): Payer: No Typology Code available for payment source | Admitting: Family Medicine

## 2014-03-29 ENCOUNTER — Emergency Department (HOSPITAL_COMMUNITY)
Admission: EM | Admit: 2014-03-29 | Discharge: 2014-03-29 | Disposition: A | Discharge status: Home / Self Care | Payer: No Typology Code available for payment source | Attending: Emergency Medicine | Admitting: Emergency Medicine

## 2014-03-29 ENCOUNTER — Encounter: Payer: Self-pay | Admitting: Family Medicine

## 2014-03-29 ENCOUNTER — Ambulatory Visit: Payer: No Typology Code available for payment source | Admitting: Family Medicine

## 2014-03-29 ENCOUNTER — Encounter (HOSPITAL_COMMUNITY): Payer: Self-pay | Admitting: Emergency Medicine

## 2014-03-29 VITALS — BP 178/111 | HR 87 | Ht 73.0 in | Wt 392.9 lb

## 2014-03-29 DIAGNOSIS — R3981 Functional urinary incontinence: Secondary | ICD-10-CM

## 2014-03-29 DIAGNOSIS — M542 Cervicalgia: Secondary | ICD-10-CM

## 2014-03-29 DIAGNOSIS — R7303 Prediabetes: Secondary | ICD-10-CM

## 2014-03-29 DIAGNOSIS — F980 Enuresis not due to a substance or known physiological condition: Secondary | ICD-10-CM | POA: Insufficient documentation

## 2014-03-29 DIAGNOSIS — R7309 Other abnormal glucose: Secondary | ICD-10-CM

## 2014-03-29 DIAGNOSIS — I10 Essential (primary) hypertension: Secondary | ICD-10-CM

## 2014-03-29 LAB — POCT UA - MICROSCOPIC ONLY

## 2014-03-29 LAB — POCT URINALYSIS DIPSTICK
Bilirubin, UA: NEGATIVE
GLUCOSE UA: NEGATIVE
Ketones, UA: NEGATIVE
LEUKOCYTES UA: NEGATIVE
Nitrite, UA: NEGATIVE
PROTEIN UA: 100
Spec Grav, UA: 1.02
Urobilinogen, UA: 0.2
pH, UA: 7

## 2014-03-29 LAB — POCT GLYCOSYLATED HEMOGLOBIN (HGB A1C): Hemoglobin A1C: 6.2

## 2014-03-29 MED ORDER — CYCLOBENZAPRINE HCL 10 MG PO TABS: 10.0000 mg | ORAL_TABLET | Freq: Three times a day (TID) | ORAL | Status: DC | PRN

## 2014-03-29 MED ORDER — KETOROLAC TROMETHAMINE 60 MG/2ML IM SOLN
60.0000 mg | Freq: Once | INTRAMUSCULAR | Status: AC
  Administered 2014-03-29: 60 mg via INTRAMUSCULAR

## 2014-03-29 NOTE — Assessment & Plan Note (Signed)
A: uncontrolled due to non compliance P: Take both HCTZ and lisinopril at home today. Take in the morning.  Set phone alarm for 8:30 as we agreed.
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
## 2014-03-29 NOTE — Assessment & Plan Note (Signed)
Stable A1c.
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
## 2014-03-29 NOTE — Discharge Instructions (Signed)
Cervical Sprain A cervical sprain is an injury in the neck in which the strong, fibrous tissues (ligaments) that connect your neck bones stretch or tear. Cervical sprains can range from mild to severe. Severe cervical sprains can cause the neck vertebrae to be unstable. This can lead to damage of the spinal cord and can result in serious nervous system problems. The amount of time it takes for a cervical sprain to get better depends on the cause and extent of the injury. Most cervical sprains heal in 1 to 3 weeks. CAUSES  Severe cervical sprains may be caused by:   Contact sport injuries (such as from football, rugby, wrestling, hockey, auto racing, gymnastics, diving, martial arts, or boxing).   Motor vehicle collisions.   Whiplash injuries. This is an injury from a sudden forward-and backward whipping movement of the head and neck.  Falls.  Mild cervical sprains may be caused by:   Being in an awkward position, such as while cradling a telephone between your ear and shoulder.   Sitting in a chair that does not offer proper support.   Working at a poorly designed computer station.   Looking up or down for long periods of time.  SYMPTOMS   Pain, soreness, stiffness, or a burning sensation in the front, back, or sides of the neck. This discomfort may develop immediately after the injury or slowly, 24 hours or more after the injury.   Pain or tenderness directly in the middle of the back of the neck.   Shoulder or upper back pain.   Limited ability to move the neck.   Headache.   Dizziness.   Weakness, numbness, or tingling in the hands or arms.   Muscle spasms.   Difficulty swallowing or chewing.   Tenderness and swelling of the neck.  DIAGNOSIS  Most of the time your health care provider can diagnose a cervical sprain by taking your history and doing a physical exam. Your health care provider will ask about previous neck injuries and any known neck  problems, such as arthritis in the neck. X-rays may be taken to find out if there are any other problems, such as with the bones of the neck. Other tests, such as a CT scan or MRI, may also be needed.  TREATMENT  Treatment depends on the severity of the cervical sprain. Mild sprains can be treated with rest, keeping the neck in place (immobilization), and pain medicines. Severe cervical sprains are immediately immobilized. Further treatment is done to help with pain, muscle spasms, and other symptoms and may include:  Medicines, such as pain relievers, numbing medicines, or muscle relaxants.   Physical therapy. This may involve stretching exercises, strengthening exercises, and posture training. Exercises and improved posture can help stabilize the neck, strengthen muscles, and help stop symptoms from returning.  HOME CARE INSTRUCTIONS   Put ice on the injured area.   Put ice in a plastic bag.   Place a towel between your skin and the bag.   Leave the ice on for 15 20 minutes, 3 4 times a day.   If your injury was severe, you may have been given a cervical collar to wear. A cervical collar is a two-piece collar designed to keep your neck from moving while it heals.  Do not remove the collar unless instructed by your health care provider.  If you have long hair, keep it outside of the collar.  Ask your health care provider before making any adjustments to your collar.   Minor adjustments may be required over time to improve comfort and reduce pressure on your chin or on the back of your head.  Ifyou are allowed to remove the collar for cleaning or bathing, follow your health care provider's instructions on how to do so safely.  Keep your collar clean by wiping it with mild soap and water and drying it completely. If the collar you have been given includes removable pads, remove them every 1 2 days and hand wash them with soap and water. Allow them to air dry. They should be completely  dry before you wear them in the collar.  If you are allowed to remove the collar for cleaning and bathing, wash and dry the skin of your neck. Check your skin for irritation or sores. If you see any, tell your health care provider.  Do not drive while wearing the collar.   Only take over-the-counter or prescription medicines for pain, discomfort, or fever as directed by your health care provider.   Keep all follow-up appointments as directed by your health care provider.   Keep all physical therapy appointments as directed by your health care provider.   Make any needed adjustments to your workstation to promote good posture.   Avoid positions and activities that make your symptoms worse.   Warm up and stretch before being active to help prevent problems.  SEEK MEDICAL CARE IF:   Your pain is not controlled with medicine.   You are unable to decrease your pain medicine over time as planned.   Your activity level is not improving as expected.  SEEK IMMEDIATE MEDICAL CARE IF:   You develop any bleeding.  You develop stomach upset.  You have signs of an allergic reaction to your medicine.   Your symptoms get worse.   You develop new, unexplained symptoms.   You have numbness, tingling, weakness, or paralysis in any part of your body.  MAKE SURE YOU:   Understand these instructions.  Will watch your condition.  Will get help right away if you are not doing well or get worse. Document Released: 09/15/2007 Document Revised: 09/08/2013 Document Reviewed: 05/26/2013 [employer] Patient Information 2014 [employer].  
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
## 2014-03-30 ENCOUNTER — Encounter: Payer: Self-pay | Admitting: Family Medicine

## 2014-03-30 ENCOUNTER — Ambulatory Visit
Admission: RE | Admit: 2014-03-30 | Discharge: 2014-03-30 | Disposition: A | Discharge status: Home / Self Care | Payer: No Typology Code available for payment source | Source: Ambulatory Visit | Attending: Family Medicine | Admitting: Family Medicine

## 2014-03-30 DIAGNOSIS — M542 Cervicalgia: Secondary | ICD-10-CM

## 2014-03-30 LAB — BASIC METABOLIC PANEL
BUN: 9 mg/dL (ref 6–23)
CALCIUM: 9.5 mg/dL (ref 8.4–10.5)
CO2: 27 mEq/L (ref 19–32)
Chloride: 101 mEq/L (ref 96–112)
Creat: 0.79 mg/dL (ref 0.50–1.35)
Glucose, Bld: 95 mg/dL (ref 70–99)
Potassium: 4.4 mEq/L (ref 3.5–5.3)
SODIUM: 138 meq/L (ref 135–145)

## 2014-03-30 LAB — URINE CULTURE
Colony Count: NO GROWTH
ORGANISM ID, BACTERIA: NO GROWTH

## 2014-03-30 NOTE — ED Provider Notes (Signed)
Medical screening examination/treatment/procedure(s) were performed by non-physician practitioner and as supervising physician I was immediately available for consultation/collaboration.   EKG Interpretation None        Enid SkeensJoshua M Yumalay Circle, MD 03/30/14 [phone]

## 2014-03-30 NOTE — Addendum Note (Signed)
Addended by: Dessa PhiFUNCHES, Blanch Stang on: 03/30/2014 05:35 PM   Modules accepted: Orders

## 2014-03-30 NOTE — Addendum Note (Signed)
Addended by: Jone BasemanFLEEGER, Shanai Lartigue D on: 03/30/2014 01:48 PM   Modules accepted: Orders

## 2014-04-02 ENCOUNTER — Encounter: Payer: Self-pay | Admitting: Family Medicine

## 2014-04-07 ENCOUNTER — Institutional Professional Consult (permissible substitution): Payer: No Typology Code available for payment source | Admitting: Pulmonary Disease

## 2014-04-11 ENCOUNTER — Ambulatory Visit (HOSPITAL_BASED_OUTPATIENT_CLINIC_OR_DEPARTMENT_OTHER): Payer: No Typology Code available for payment source | Attending: Family Medicine

## 2014-04-11 VITALS — Ht 74.0 in | Wt 393.0 lb

## 2014-04-11 DIAGNOSIS — Z9989 Dependence on other enabling machines and devices: Secondary | ICD-10-CM

## 2014-04-11 DIAGNOSIS — G4733 Obstructive sleep apnea (adult) (pediatric): Secondary | ICD-10-CM

## 2014-04-16 DIAGNOSIS — G4710 Hypersomnia, unspecified: Secondary | ICD-10-CM

## 2014-04-16 DIAGNOSIS — G4730 Sleep apnea, unspecified: Secondary | ICD-10-CM

## 2014-04-16 NOTE — Sleep Study (Signed)
   NAME: Andrew Larson DATE OF BIRTH:  [birth year] MEDICAL RECORD NUMBER [id number]  LOCATION: [facility]  PHYSICIAN: Clinton D Young  DATE OF STUDY: 04/11/2014  SLEEP STUDY TYPE: Nocturnal Polysomnogram               REFERRING PHYSICIAN: Ozella RocksMerrell, David J, MD  INDICATION FOR STUDY: Hypersomnia with sleep apnea  EPWORTH SLEEPINESS SCORE:   24/24 HEIGHT: 6\' 2"  (188 cm)  WEIGHT: 393 lb (178.264 kg)    Body mass index is 50.44 kg/(m^2).  NECK SIZE: 20 in.  MEDICATIONS: Charted for review  SLEEP ARCHITECTURE: Split study protocol. During the diagnostic phase, total sleep time 120 minutes with sleep efficiency 96.4%. Stage I was 16.3%, stage II 83.8%, stage III and REM were absent. Sleep latency 3.5 minutes, awake after sleep onset 1 minute, arousal index 34, bedtime medication: None  RESPIRATORY DATA: Apnea hypopnea index (AHI) 100.5 per hour. 201 total events scored including 148 obstructive apneas, 2 central apneas, 12 mixed apneas, 39 hypopneas. Events were not positional. REM AHI 0. CPAP titrated to 12 CWP, AHI 0.9 per hour. He wore a large Simplus fullface mask with heated humidifier and EPR of 3  OXYGEN DATA: Very loud snoring before CPAP with oxygen desaturation to a nadir of 63% on room air. With CPAP control, snoring was prevented and mean oxygen saturation of 93.8% on room air.  CARDIAC DATA: Normal sinus rhythm  MOVEMENT/PARASOMNIA: No significant movement disturbance, bathroom x1  IMPRESSION/ RECOMMENDATION:   1) Severe obstructive sleep apnea/hypopnea syndrome, AHI 100.5 per hour with non-positional events. Very loud snoring with oxygen desaturation to a nadir of 63% on room air.  2) Successful CPAP titration to 12 CWP, AHI 0.9 per hour. He wore a large [employer] Simplus fullface mask with heated humidifier and an EPR of 3. Snoring was prevented and mean oxygen saturation held at 93.8% on room air. 3) A polysomnogram on 07/18/2010 recorded AHI  68.3 per hour with body weight 285 pounds. CPAP was titrated to 19 CWP at that time.  Signed Jetty Duhamellinton Young M.D. Waymon Budgelinton D Young Diplomate, Biomedical engineerAmerican Board of Sleep Medicine  ELECTRONICALLY SIGNED ON:  04/16/2014, 9:32 AM [facility] PH: [phone]   FX: [phone] ACCREDITED BY THE AMERICAN ACADEMY OF SLEEP MEDICINE

## 2014-04-18 ENCOUNTER — Ambulatory Visit: Payer: No Typology Code available for payment source | Attending: Family Medicine

## 2014-04-18 DIAGNOSIS — M542 Cervicalgia: Secondary | ICD-10-CM | POA: Insufficient documentation

## 2014-04-18 DIAGNOSIS — M2560 Stiffness of unspecified joint, not elsewhere classified: Secondary | ICD-10-CM | POA: Insufficient documentation

## 2014-04-18 DIAGNOSIS — IMO0001 Reserved for inherently not codable concepts without codable children: Secondary | ICD-10-CM | POA: Insufficient documentation

## 2014-04-20 ENCOUNTER — Ambulatory Visit: Payer: No Typology Code available for payment source | Admitting: Physical Therapy

## 2014-04-27 ENCOUNTER — Encounter: Payer: No Typology Code available for payment source | Admitting: Physical Therapy

## 2014-04-29 ENCOUNTER — Ambulatory Visit: Payer: No Typology Code available for payment source

## 2014-05-03 ENCOUNTER — Encounter: Payer: No Typology Code available for payment source | Admitting: Physical Therapy

## 2014-05-27 ENCOUNTER — Encounter: Payer: No Typology Code available for payment source | Admitting: Family Medicine

## 2014-06-08 ENCOUNTER — Ambulatory Visit: Payer: No Typology Code available for payment source | Admitting: Family Medicine

## 2014-06-20 ENCOUNTER — Encounter (HOSPITAL_COMMUNITY): Payer: Self-pay | Admitting: Emergency Medicine

## 2014-06-20 ENCOUNTER — Emergency Department (HOSPITAL_COMMUNITY)
Admission: EM | Admit: 2014-06-20 | Discharge: 2014-06-20 | Disposition: A | Discharge status: Home / Self Care | Payer: No Typology Code available for payment source | Attending: Emergency Medicine | Admitting: Emergency Medicine

## 2014-06-20 DIAGNOSIS — S39012A Strain of muscle, fascia and tendon of lower back, initial encounter: Secondary | ICD-10-CM

## 2014-06-20 DIAGNOSIS — IMO0002 Reserved for concepts with insufficient information to code with codable children: Secondary | ICD-10-CM | POA: Insufficient documentation

## 2014-06-20 DIAGNOSIS — J45909 Unspecified asthma, uncomplicated: Secondary | ICD-10-CM | POA: Insufficient documentation

## 2014-06-20 DIAGNOSIS — Z8659 Personal history of other mental and behavioral disorders: Secondary | ICD-10-CM | POA: Insufficient documentation

## 2014-06-20 DIAGNOSIS — I10 Essential (primary) hypertension: Secondary | ICD-10-CM | POA: Insufficient documentation

## 2014-06-20 DIAGNOSIS — Z79899 Other long term (current) drug therapy: Secondary | ICD-10-CM | POA: Insufficient documentation

## 2014-06-20 DIAGNOSIS — Y92410 Unspecified street and highway as the place of occurrence of the external cause: Secondary | ICD-10-CM | POA: Insufficient documentation

## 2014-06-20 DIAGNOSIS — Y9389 Activity, other specified: Secondary | ICD-10-CM | POA: Insufficient documentation

## 2014-06-20 MED ORDER — METHOCARBAMOL 500 MG PO TABS: 500.0000 mg | ORAL_TABLET | Freq: Two times a day (BID) | ORAL | Status: DC

## 2014-06-20 MED ORDER — MELOXICAM 7.5 MG PO TABS: 15.0000 mg | ORAL_TABLET | Freq: Every day | ORAL | Status: DC
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
## 2014-06-20 NOTE — Discharge Instructions (Signed)

## 2014-06-21 NOTE — ED Provider Notes (Signed)
Medical screening examination/treatment/procedure(s) were performed by non-physician practitioner and as supervising physician I was immediately available for consultation/collaboration.   EKG Interpretation None        Virtie Bungert, MD 06/21/14 0025 

## 2014-07-22 ENCOUNTER — Encounter (HOSPITAL_COMMUNITY): Payer: Self-pay | Admitting: Emergency Medicine

## 2014-07-22 ENCOUNTER — Emergency Department (HOSPITAL_COMMUNITY)
Admission: EM | Admit: 2014-07-22 | Discharge: 2014-07-22 | Disposition: A | Discharge status: Home / Self Care | Payer: No Typology Code available for payment source | Attending: Emergency Medicine | Admitting: Emergency Medicine

## 2014-07-22 DIAGNOSIS — S298XXA Other specified injuries of thorax, initial encounter: Secondary | ICD-10-CM | POA: Insufficient documentation

## 2014-07-22 DIAGNOSIS — S2341XA Sprain of ribs, initial encounter: Secondary | ICD-10-CM | POA: Insufficient documentation

## 2014-07-22 DIAGNOSIS — Y92410 Unspecified street and highway as the place of occurrence of the external cause: Secondary | ICD-10-CM | POA: Insufficient documentation

## 2014-07-22 DIAGNOSIS — Z8659 Personal history of other mental and behavioral disorders: Secondary | ICD-10-CM | POA: Insufficient documentation

## 2014-07-22 DIAGNOSIS — Z791 Long term (current) use of non-steroidal anti-inflammatories (NSAID): Secondary | ICD-10-CM | POA: Insufficient documentation

## 2014-07-22 DIAGNOSIS — S29011A Strain of muscle and tendon of front wall of thorax, initial encounter: Secondary | ICD-10-CM

## 2014-07-22 DIAGNOSIS — I10 Essential (primary) hypertension: Secondary | ICD-10-CM | POA: Insufficient documentation

## 2014-07-22 DIAGNOSIS — Y9389 Activity, other specified: Secondary | ICD-10-CM | POA: Insufficient documentation

## 2014-07-22 DIAGNOSIS — Z8669 Personal history of other diseases of the nervous system and sense organs: Secondary | ICD-10-CM | POA: Insufficient documentation

## 2014-07-22 DIAGNOSIS — Z79899 Other long term (current) drug therapy: Secondary | ICD-10-CM | POA: Insufficient documentation

## 2014-07-22 DIAGNOSIS — J45909 Unspecified asthma, uncomplicated: Secondary | ICD-10-CM | POA: Insufficient documentation

## 2014-07-22 MED ORDER — IBUPROFEN 200 MG PO TABS
600.0000 mg | ORAL_TABLET | Freq: Once | ORAL | Status: AC
  Administered 2014-07-22: 600 mg via ORAL
  Filled 2014-07-22: qty 3
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
## 2014-07-22 NOTE — Discharge Instructions (Signed)
Cryotherapy °Cryotherapy means treatment with cold. Ice or gel packs can be used to reduce both pain and swelling. Ice is the most helpful within the first 24 to 48 hours after an injury or flare-up from overusing a muscle or joint. Sprains, strains, spasms, burning pain, shooting pain, and aches can all be eased with ice. Ice can also be used when recovering from surgery. Ice is effective, has very few side effects, and is safe for most people to use. °PRECAUTIONS  °Ice is not a safe treatment option for people with: °· Raynaud phenomenon. This is a condition affecting small blood vessels in the extremities. Exposure to cold may cause your problems to return. °· Cold hypersensitivity. There are many forms of cold hypersensitivity, including: °¨ Cold urticaria. Red, itchy hives appear on the skin when the tissues begin to warm after being iced. °¨ Cold erythema. This is a red, itchy rash caused by exposure to cold. °¨ Cold hemoglobinuria. Red blood cells break down when the tissues begin to warm after being iced. The hemoglobin that carry oxygen are passed into the urine because they cannot combine with blood proteins fast enough. °· Numbness or altered sensitivity in the area being iced. °If you have any of the following conditions, do not use ice until you have discussed cryotherapy with your caregiver: °· Heart conditions, such as arrhythmia, angina, or chronic heart disease. °· High blood pressure. °· Healing wounds or open skin in the area being iced. °· Current infections. °· Rheumatoid arthritis. °· Poor circulation. °· Diabetes. °Ice slows the blood flow in the region it is applied. This is beneficial when trying to stop inflamed tissues from spreading irritating chemicals to surrounding tissues. However, if you expose your skin to cold temperatures for too long or without the proper protection, you can damage your skin or nerves. Watch for signs of skin damage due to cold. °HOME CARE INSTRUCTIONS °Follow  these tips to use ice and cold packs safely. °· Place a dry or damp towel between the ice and skin. A damp towel will cool the skin more quickly, so you may need to shorten the time that the ice is used. °· For a more rapid response, add gentle compression to the ice. °· Ice for no more than 10 to 20 minutes at a time. The bonier the area you are icing, the less time it will take to get the benefits of ice. °· Check your skin after 5 minutes to make sure there are no signs of a poor response to cold or skin damage. °· Rest 20 minutes or more between uses. °· Once your skin is numb, you can end your treatment. You can test numbness by very lightly touching your skin. The touch should be so light that you do not see the skin dimple from the pressure of your fingertip. When using ice, most people will feel these normal sensations in this order: cold, burning, aching, and numbness. °· Do not use ice on someone who cannot communicate their responses to pain, such as small children or people with dementia. °HOW TO MAKE AN ICE PACK °Ice packs are the most common way to use ice therapy. Other methods include ice massage, ice baths, and cryosprays. Muscle creams that cause a cold, tingly feeling do not offer the same benefits that ice offers and should not be used as a substitute unless recommended by your caregiver. °To make an ice pack, do one of the following: °· Place crushed ice or a   bag of frozen vegetables in a sealable plastic bag. Squeeze out the excess air. Place this bag inside another plastic bag. Slide the bag into a pillowcase or place a damp towel between your skin and the bag.  Mix 3 parts water with 1 part rubbing alcohol. Freeze the mixture in a sealable plastic bag. When you remove the mixture from the freezer, it will be slushy. Squeeze out the excess air. Place this bag inside another plastic bag. Slide the bag into a pillowcase or place a damp towel between your skin and the bag. SEEK MEDICAL CARE  IF:  You develop white spots on your skin. This may give the skin a blotchy (mottled) appearance.  Your skin turns blue or pale.  Your skin becomes waxy or hard.  Your swelling gets worse. MAKE SURE YOU:   Understand these instructions.  Will watch your condition.  Will get help right away if you are not doing well or get worse. Document Released: 07/15/2011 Document Revised: 04/04/2014 Document Reviewed: 07/15/2011 [facility] Patient Information 2015 [employer] [place]. This information is not intended to replace advice given to you by your health care provider. Make sure you discuss any questions you have with your health care provider. Please take your Mobic for pain Follow up with your PCP as needed

## 2014-07-22 NOTE — ED Provider Notes (Signed)
Medical screening examination/treatment/procedure(s) were performed by non-physician practitioner and as supervising physician I was immediately available for consultation/collaboration.   EKG Interpretation None        David H Yao, MD 07/22/14 2341 

## 2014-08-24 ENCOUNTER — Emergency Department (HOSPITAL_COMMUNITY)
Admission: EM | Admit: 2014-08-24 | Discharge: 2014-08-24 | Disposition: A | Discharge status: Home / Self Care | Payer: No Typology Code available for payment source | Attending: Emergency Medicine | Admitting: Emergency Medicine

## 2014-08-24 ENCOUNTER — Encounter (HOSPITAL_COMMUNITY): Payer: Self-pay | Admitting: Emergency Medicine

## 2014-08-24 ENCOUNTER — Emergency Department (HOSPITAL_COMMUNITY): Payer: No Typology Code available for payment source

## 2014-08-24 ENCOUNTER — Emergency Department (HOSPITAL_COMMUNITY): Payer: Self-pay

## 2014-08-24 DIAGNOSIS — M25561 Pain in right knee: Secondary | ICD-10-CM

## 2014-08-24 DIAGNOSIS — S99919A Unspecified injury of unspecified ankle, initial encounter: Principal | ICD-10-CM

## 2014-08-24 DIAGNOSIS — J45909 Unspecified asthma, uncomplicated: Secondary | ICD-10-CM | POA: Insufficient documentation

## 2014-08-24 DIAGNOSIS — M79641 Pain in right hand: Secondary | ICD-10-CM

## 2014-08-24 DIAGNOSIS — Y9389 Activity, other specified: Secondary | ICD-10-CM | POA: Insufficient documentation

## 2014-08-24 DIAGNOSIS — S6990XA Unspecified injury of unspecified wrist, hand and finger(s), initial encounter: Secondary | ICD-10-CM | POA: Insufficient documentation

## 2014-08-24 DIAGNOSIS — Y92410 Unspecified street and highway as the place of occurrence of the external cause: Secondary | ICD-10-CM | POA: Insufficient documentation

## 2014-08-24 DIAGNOSIS — Z8659 Personal history of other mental and behavioral disorders: Secondary | ICD-10-CM | POA: Insufficient documentation

## 2014-08-24 DIAGNOSIS — Z79899 Other long term (current) drug therapy: Secondary | ICD-10-CM | POA: Insufficient documentation

## 2014-08-24 DIAGNOSIS — S99929A Unspecified injury of unspecified foot, initial encounter: Principal | ICD-10-CM

## 2014-08-24 DIAGNOSIS — S8990XA Unspecified injury of unspecified lower leg, initial encounter: Secondary | ICD-10-CM | POA: Insufficient documentation

## 2014-08-24 DIAGNOSIS — I10 Essential (primary) hypertension: Secondary | ICD-10-CM | POA: Insufficient documentation

## 2014-08-24 DIAGNOSIS — Z8669 Personal history of other diseases of the nervous system and sense organs: Secondary | ICD-10-CM | POA: Insufficient documentation

## 2014-08-24 DIAGNOSIS — Z791 Long term (current) use of non-steroidal anti-inflammatories (NSAID): Secondary | ICD-10-CM | POA: Insufficient documentation

## 2014-08-24 MED ORDER — NAPROXEN 500 MG PO TABS: 500.0000 mg | ORAL_TABLET | Freq: Two times a day (BID) | ORAL | Status: DC

## 2014-08-24 MED ORDER — HYDROCODONE-ACETAMINOPHEN 5-325 MG PO TABS: 1.0000 | ORAL_TABLET | Freq: Four times a day (QID) | ORAL | Status: DC | PRN
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
## 2014-08-24 NOTE — ED Provider Notes (Signed)
Medical screening examination/treatment/procedure(s) were performed by non-physician practitioner and as supervising physician I was immediately available for consultation/collaboration.  Mahala Rommel T Kenise Barraco, MD 08/24/14 1540 
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
## 2014-08-24 NOTE — ED Notes (Signed)
Awaiting ortho tech for crutches and teaching. Pt will be discharged after this.

## 2014-08-31 ENCOUNTER — Emergency Department (HOSPITAL_COMMUNITY): Payer: No Typology Code available for payment source

## 2014-08-31 ENCOUNTER — Emergency Department (HOSPITAL_COMMUNITY)
Admission: EM | Admit: 2014-08-31 | Discharge: 2014-08-31 | Disposition: A | Discharge status: Home / Self Care | Payer: No Typology Code available for payment source | Attending: Emergency Medicine | Admitting: Emergency Medicine

## 2014-08-31 ENCOUNTER — Encounter (HOSPITAL_COMMUNITY): Payer: Self-pay | Admitting: Emergency Medicine

## 2014-08-31 DIAGNOSIS — Z791 Long term (current) use of non-steroidal anti-inflammatories (NSAID): Secondary | ICD-10-CM | POA: Insufficient documentation

## 2014-08-31 DIAGNOSIS — J45909 Unspecified asthma, uncomplicated: Secondary | ICD-10-CM | POA: Insufficient documentation

## 2014-08-31 DIAGNOSIS — R51 Headache: Secondary | ICD-10-CM | POA: Insufficient documentation

## 2014-08-31 DIAGNOSIS — M25519 Pain in unspecified shoulder: Secondary | ICD-10-CM | POA: Insufficient documentation

## 2014-08-31 DIAGNOSIS — M542 Cervicalgia: Secondary | ICD-10-CM | POA: Insufficient documentation

## 2014-08-31 DIAGNOSIS — Z8659 Personal history of other mental and behavioral disorders: Secondary | ICD-10-CM | POA: Insufficient documentation

## 2014-08-31 DIAGNOSIS — G8911 Acute pain due to trauma: Secondary | ICD-10-CM | POA: Insufficient documentation

## 2014-08-31 DIAGNOSIS — S134XXS Sprain of ligaments of cervical spine, sequela: Secondary | ICD-10-CM

## 2014-08-31 DIAGNOSIS — Z8669 Personal history of other diseases of the nervous system and sense organs: Secondary | ICD-10-CM | POA: Insufficient documentation

## 2014-08-31 DIAGNOSIS — I10 Essential (primary) hypertension: Secondary | ICD-10-CM | POA: Insufficient documentation

## 2014-08-31 DIAGNOSIS — Z79899 Other long term (current) drug therapy: Secondary | ICD-10-CM | POA: Insufficient documentation

## 2014-08-31 MED ORDER — METHOCARBAMOL 500 MG PO TABS: 500.0000 mg | ORAL_TABLET | Freq: Two times a day (BID) | ORAL | Status: DC
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
## 2014-08-31 NOTE — Discharge Instructions (Signed)

## 2014-09-02 NOTE — ED Provider Notes (Signed)
Medical screening examination/treatment/procedure(s) were performed by non-physician practitioner and as supervising physician I was immediately available for consultation/collaboration.   EKG Interpretation None       Arby BarretteMarcy Jerra Huckeby, MD 09/02/14 [phone]

## 2014-09-12 ENCOUNTER — Encounter (HOSPITAL_COMMUNITY): Payer: Self-pay | Admitting: Emergency Medicine

## 2014-09-12 ENCOUNTER — Emergency Department (HOSPITAL_COMMUNITY)
Admission: EM | Admit: 2014-09-12 | Discharge: 2014-09-12 | Disposition: A | Discharge status: Home / Self Care | Payer: No Typology Code available for payment source | Attending: Emergency Medicine | Admitting: Emergency Medicine

## 2014-09-12 DIAGNOSIS — Z791 Long term (current) use of non-steroidal anti-inflammatories (NSAID): Secondary | ICD-10-CM | POA: Insufficient documentation

## 2014-09-12 DIAGNOSIS — I10 Essential (primary) hypertension: Secondary | ICD-10-CM | POA: Insufficient documentation

## 2014-09-12 DIAGNOSIS — M62838 Other muscle spasm: Secondary | ICD-10-CM

## 2014-09-12 DIAGNOSIS — S199XXD Unspecified injury of neck, subsequent encounter: Secondary | ICD-10-CM | POA: Insufficient documentation

## 2014-09-12 DIAGNOSIS — Z79899 Other long term (current) drug therapy: Secondary | ICD-10-CM | POA: Insufficient documentation

## 2014-09-12 DIAGNOSIS — G43909 Migraine, unspecified, not intractable, without status migrainosus: Secondary | ICD-10-CM | POA: Insufficient documentation

## 2014-09-12 DIAGNOSIS — J45909 Unspecified asthma, uncomplicated: Secondary | ICD-10-CM | POA: Insufficient documentation

## 2014-09-12 DIAGNOSIS — Z8659 Personal history of other mental and behavioral disorders: Secondary | ICD-10-CM | POA: Insufficient documentation

## 2014-09-12 MED ORDER — DIAZEPAM 5 MG PO TABS: 5.0000 mg | ORAL_TABLET | Freq: Two times a day (BID) | ORAL | Status: DC | PRN
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
## 2014-11-18 ENCOUNTER — Ambulatory Visit: Payer: No Typology Code available for payment source

## 2014-12-16 ENCOUNTER — Ambulatory Visit: Payer: No Typology Code available for payment source | Admitting: Family Medicine

## 2014-12-22 ENCOUNTER — Ambulatory Visit: Payer: No Typology Code available for payment source

## 2015-01-19 ENCOUNTER — Emergency Department (HOSPITAL_COMMUNITY)
Admission: EM | Admit: 2015-01-19 | Discharge: 2015-01-19 | Disposition: A | Discharge status: Home / Self Care | Payer: No Typology Code available for payment source | Attending: Emergency Medicine | Admitting: Emergency Medicine

## 2015-01-19 ENCOUNTER — Emergency Department (HOSPITAL_COMMUNITY): Payer: No Typology Code available for payment source

## 2015-01-19 ENCOUNTER — Encounter (HOSPITAL_COMMUNITY): Payer: Self-pay | Admitting: Emergency Medicine

## 2015-01-19 DIAGNOSIS — J45909 Unspecified asthma, uncomplicated: Secondary | ICD-10-CM | POA: Insufficient documentation

## 2015-01-19 DIAGNOSIS — S20211A Contusion of right front wall of thorax, initial encounter: Secondary | ICD-10-CM

## 2015-01-19 DIAGNOSIS — F99 Mental disorder, not otherwise specified: Secondary | ICD-10-CM | POA: Insufficient documentation

## 2015-01-19 DIAGNOSIS — I10 Essential (primary) hypertension: Secondary | ICD-10-CM | POA: Insufficient documentation

## 2015-01-19 DIAGNOSIS — Y9389 Activity, other specified: Secondary | ICD-10-CM | POA: Insufficient documentation

## 2015-01-19 DIAGNOSIS — Y998 Other external cause status: Secondary | ICD-10-CM | POA: Insufficient documentation

## 2015-01-19 DIAGNOSIS — Y92410 Unspecified street and highway as the place of occurrence of the external cause: Secondary | ICD-10-CM | POA: Insufficient documentation

## 2015-01-19 DIAGNOSIS — Z79899 Other long term (current) drug therapy: Secondary | ICD-10-CM | POA: Insufficient documentation

## 2015-01-19 DIAGNOSIS — R0789 Other chest pain: Secondary | ICD-10-CM

## 2015-01-19 DIAGNOSIS — G40909 Epilepsy, unspecified, not intractable, without status epilepticus: Secondary | ICD-10-CM | POA: Insufficient documentation

## 2015-01-19 MED ORDER — OXYCODONE-ACETAMINOPHEN 5-325 MG PO TABS
1.0000 | ORAL_TABLET | Freq: Once | ORAL | Status: AC
  Administered 2015-01-19: 1 via ORAL
  Filled 2015-01-19: qty 1

## 2015-01-19 MED ORDER — NAPROXEN 500 MG PO TABS
500.0000 mg | ORAL_TABLET | Freq: Once | ORAL | Status: AC
  Administered 2015-01-19: 500 mg via ORAL
  Filled 2015-01-19: qty 1

## 2015-01-19 MED ORDER — NAPROXEN 500 MG PO TABS: 500.0000 mg | ORAL_TABLET | Freq: Two times a day (BID) | ORAL | Status: DC
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
## 2015-01-21 ENCOUNTER — Emergency Department (HOSPITAL_COMMUNITY)
Admission: EM | Admit: 2015-01-21 | Discharge: 2015-01-21 | Disposition: A | Discharge status: Home / Self Care | Payer: No Typology Code available for payment source | Attending: Emergency Medicine | Admitting: Emergency Medicine

## 2015-01-21 ENCOUNTER — Encounter (HOSPITAL_COMMUNITY): Payer: Self-pay

## 2015-01-21 DIAGNOSIS — Z79899 Other long term (current) drug therapy: Secondary | ICD-10-CM | POA: Insufficient documentation

## 2015-01-21 DIAGNOSIS — Z791 Long term (current) use of non-steroidal anti-inflammatories (NSAID): Secondary | ICD-10-CM | POA: Insufficient documentation

## 2015-01-21 DIAGNOSIS — G40909 Epilepsy, unspecified, not intractable, without status epilepticus: Secondary | ICD-10-CM | POA: Insufficient documentation

## 2015-01-21 DIAGNOSIS — Z8659 Personal history of other mental and behavioral disorders: Secondary | ICD-10-CM | POA: Insufficient documentation

## 2015-01-21 DIAGNOSIS — G8911 Acute pain due to trauma: Secondary | ICD-10-CM | POA: Insufficient documentation

## 2015-01-21 DIAGNOSIS — I10 Essential (primary) hypertension: Secondary | ICD-10-CM | POA: Insufficient documentation

## 2015-01-21 DIAGNOSIS — J45909 Unspecified asthma, uncomplicated: Secondary | ICD-10-CM | POA: Insufficient documentation

## 2015-01-21 DIAGNOSIS — M62838 Other muscle spasm: Secondary | ICD-10-CM

## 2015-01-21 DIAGNOSIS — S46911D Strain of unspecified muscle, fascia and tendon at shoulder and upper arm level, right arm, subsequent encounter: Secondary | ICD-10-CM | POA: Insufficient documentation

## 2015-01-21 DIAGNOSIS — Z8669 Personal history of other diseases of the nervous system and sense organs: Secondary | ICD-10-CM | POA: Insufficient documentation

## 2015-01-21 MED ORDER — METHOCARBAMOL 500 MG PO TABS: 500.0000 mg | ORAL_TABLET | Freq: Two times a day (BID) | ORAL | Status: DC

## 2015-01-21 MED ORDER — METHOCARBAMOL 500 MG PO TABS
500.0000 mg | ORAL_TABLET | Freq: Once | ORAL | Status: AC
  Administered 2015-01-21: 500 mg via ORAL
  Filled 2015-01-21: qty 1

## 2015-01-21 NOTE — Discharge Instructions (Signed)
Take Robaxin as needed for muscle spasm. Apply heating pad to the affected area. Refer to attached documents for more information.
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
## 2015-01-23 ENCOUNTER — Emergency Department (HOSPITAL_COMMUNITY)
Admission: EM | Admit: 2015-01-23 | Discharge: 2015-01-23 | Disposition: A | Discharge status: Home / Self Care | Payer: No Typology Code available for payment source | Attending: Emergency Medicine | Admitting: Emergency Medicine

## 2015-01-23 ENCOUNTER — Encounter (HOSPITAL_COMMUNITY): Payer: Self-pay | Admitting: *Deleted

## 2015-01-23 DIAGNOSIS — J45909 Unspecified asthma, uncomplicated: Secondary | ICD-10-CM | POA: Insufficient documentation

## 2015-01-23 DIAGNOSIS — Z8659 Personal history of other mental and behavioral disorders: Secondary | ICD-10-CM | POA: Insufficient documentation

## 2015-01-23 DIAGNOSIS — Z79899 Other long term (current) drug therapy: Secondary | ICD-10-CM | POA: Insufficient documentation

## 2015-01-23 DIAGNOSIS — I10 Essential (primary) hypertension: Secondary | ICD-10-CM | POA: Insufficient documentation

## 2015-01-23 DIAGNOSIS — Z87828 Personal history of other (healed) physical injury and trauma: Secondary | ICD-10-CM | POA: Insufficient documentation

## 2015-01-23 DIAGNOSIS — M62838 Other muscle spasm: Secondary | ICD-10-CM | POA: Insufficient documentation

## 2015-01-23 MED ORDER — DIAZEPAM 5 MG PO TABS
5.0000 mg | ORAL_TABLET | Freq: Once | ORAL | Status: AC
  Administered 2015-01-23: 5 mg via ORAL
  Filled 2015-01-23: qty 1

## 2015-01-23 MED ORDER — HYDROCODONE-ACETAMINOPHEN 5-325 MG PO TABS: 1.0000 | ORAL_TABLET | Freq: Four times a day (QID) | ORAL | Status: DC | PRN

## 2015-01-23 MED ORDER — DIAZEPAM 5 MG PO TABS: 5.0000 mg | ORAL_TABLET | Freq: Two times a day (BID) | ORAL | Status: DC | PRN

## 2015-01-23 MED ORDER — KETOROLAC TROMETHAMINE 60 MG/2ML IM SOLN
60.0000 mg | Freq: Once | INTRAMUSCULAR | Status: AC
  Administered 2015-01-23: 60 mg via INTRAMUSCULAR
  Filled 2015-01-23: qty 2

## 2015-01-23 MED ORDER — METHOCARBAMOL 500 MG PO TABS: 1000.0000 mg | ORAL_TABLET | Freq: Three times a day (TID) | ORAL | Status: DC | PRN
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
## 2015-01-23 NOTE — Discharge Instructions (Signed)
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
## 2015-01-23 NOTE — ED Notes (Signed)
Pt placed in a gown and hooked up to the monitor with bp cuff and pulse ox

## 2015-03-22 ENCOUNTER — Encounter (HOSPITAL_COMMUNITY): Payer: Self-pay

## 2015-03-22 ENCOUNTER — Emergency Department (HOSPITAL_COMMUNITY)
Admission: EM | Admit: 2015-03-22 | Discharge: 2015-03-22 | Disposition: A | Discharge status: Home / Self Care | Payer: No Typology Code available for payment source | Attending: Emergency Medicine | Admitting: Emergency Medicine

## 2015-03-22 DIAGNOSIS — K644 Residual hemorrhoidal skin tags: Secondary | ICD-10-CM | POA: Insufficient documentation

## 2015-03-22 DIAGNOSIS — A630 Anogenital (venereal) warts: Secondary | ICD-10-CM | POA: Insufficient documentation

## 2015-03-22 DIAGNOSIS — I10 Essential (primary) hypertension: Secondary | ICD-10-CM | POA: Insufficient documentation

## 2015-03-22 DIAGNOSIS — J45909 Unspecified asthma, uncomplicated: Secondary | ICD-10-CM | POA: Insufficient documentation

## 2015-03-22 DIAGNOSIS — G40909 Epilepsy, unspecified, not intractable, without status epilepticus: Secondary | ICD-10-CM | POA: Insufficient documentation

## 2015-03-22 DIAGNOSIS — Z79899 Other long term (current) drug therapy: Secondary | ICD-10-CM | POA: Insufficient documentation

## 2015-03-22 DIAGNOSIS — Z791 Long term (current) use of non-steroidal anti-inflammatories (NSAID): Secondary | ICD-10-CM | POA: Insufficient documentation

## 2015-03-22 DIAGNOSIS — Z8669 Personal history of other diseases of the nervous system and sense organs: Secondary | ICD-10-CM | POA: Insufficient documentation

## 2015-03-22 DIAGNOSIS — K5900 Constipation, unspecified: Secondary | ICD-10-CM | POA: Insufficient documentation

## 2015-03-22 MED ORDER — DOCUSATE SODIUM 100 MG PO CAPS: 100.0000 mg | ORAL_CAPSULE | Freq: Two times a day (BID) | ORAL | Status: DC

## 2015-03-22 NOTE — Discharge Instructions (Signed)
Please make an appointment with Dr. Janee Mornhompson for evaluation of your condition  .  You have been given a prescription for Colace to help keep your stools soft.  Please take one tablet twice a day until stools become easy to pass.  Then back off to one tablet daily

## 2015-03-22 NOTE — ED Notes (Signed)
Pt presents with c/o hemorrhoids that have been presents for a couple of months. Pt reports that they have been bleeding and once they do, it is hard to get the bleeding to stop.

## 2015-03-22 NOTE — ED Provider Notes (Signed)
CSN: [id number]     Arrival date & time 03/22/15  0151 History   First MD Initiated Contact with Patient 03/22/15 0204     Chief Complaint  Patient presents with  . Hemorrhoids     (Consider location/radiation/quality/duration/timing/severity/associated sxs/prior Treatment) HPI Comments: Agencies he has a history of having hemorrhoid surgery in the past by Dr. Janee Morn again exactly remember where he also had a condyloma at that time removed the ports for the past several days.  He's noticed some bleeding with bowel movements.  His bowel movements have been hard and he finds he has to strain to pass them.  He has not taken any medication to help soften the stool.  Denies any abdominal pain, fever  The history is provided by the patient.    Past Medical History  Diagnosis Date  . Hypertension   . Sleep apnea   . Asthma   . Mental disorder     bipolar  . Seizures     ?? age of 4-5   Past Surgical History  Procedure Laterality Date  . Hemorrhoid surgery N/A 02/14/2014    Procedure:  HEMORRHOIDECTOMY/RECTAL BIOPSY;  Surgeon: Liz Malady, MD;  Location: [facility] OR;  Service: General;  Laterality: N/A;  . Examination under anesthesia N/A 02/14/2014    Procedure: Francia Greaves UNDER ANESTHESIA;  Surgeon: Liz Malady, MD;  Location: [facility] OR;  Service: General;  Laterality: N/A;   Family History  Problem Relation Age of Onset  . Hypertension Mother   . Diabetes Mother   . Hypertension Father    History  Substance Use Topics  . Smoking status: Never Smoker   . Smokeless tobacco: Never Used  . Alcohol Use: No    Review of Systems  Constitutional: Negative for fever.  Gastrointestinal: Positive for constipation and anal bleeding. Negative for nausea, vomiting and diarrhea.  Neurological: Negative for dizziness.  All other systems reviewed and are negative.     Allergies  Review of patient's allergies indicates no known allergies.  Home Medications   Prior to Admission  medications   Medication Sig Start Date End Date Taking? Authorizing Provider  diazepam (VALIUM) 5 MG tablet Take 1 tablet (5 mg total) by mouth 2 (two) times daily as needed for muscle spasms (spasms). 01/23/15   Loren Racer, MD  docusate sodium (COLACE) 100 MG capsule Take 1 capsule (100 mg total) by mouth every 12 (twelve) hours. 03/22/15   Earley Favor, NP  HYDROcodone-acetaminophen (NORCO/VICODIN) 5-325 MG per tablet Take 1 tablet by mouth every 6 (six) hours as needed for moderate pain or severe pain. 01/23/15   Loren Racer, MD  lisinopril (PRINIVIL,ZESTRIL) 10 MG tablet Take 1 tablet (10 mg total) by mouth every other day. 02/21/14   Ozella Rocks, MD  methocarbamol (ROBAXIN) 500 MG tablet Take 2 tablets (1,000 mg total) by mouth every 8 (eight) hours as needed for muscle spasms. 01/23/15   Loren Racer, MD  naproxen (NAPROSYN) 500 MG tablet Take 1 tablet (500 mg total) by mouth 2 (two) times daily. 01/19/15   Antony Madura, PA-C   BP 133/79 mmHg  Pulse 119  Temp(Src) 98.1 F (36.7 C) (Oral)  Resp 26  SpO2 98% Physical Exam  Constitutional: He is oriented to person, place, and time. He appears well-developed and well-nourished.  HENT:  Head: Normocephalic.  Eyes: Pupils are equal, round, and reactive to light.  Neck: Normal range of motion.  Cardiovascular: Normal rate.   Pulmonary/Chest: Effort normal.  Abdominal: Soft.  Genitourinary: Rectal exam shows external hemorrhoid.  Large condyloma  Neurological: He is alert and oriented to person, place, and time.  Skin: Skin is warm.  Nursing note and vitals reviewed.   ED Course  Procedures (including critical care time) Labs Review Labs Reviewed - No data to display  Imaging Review No results found.   EKG Interpretation None      MDM   Final diagnoses:  Condyloma  External hemorrhoid         Earley FavorGail Cortlynn Hollinsworth, NP 03/22/15 0234  Layla MawKristen N Ward, DO 03/22/15 [id number]

## 2015-04-01 ENCOUNTER — Encounter (HOSPITAL_COMMUNITY): Payer: Self-pay

## 2015-04-01 ENCOUNTER — Emergency Department (HOSPITAL_COMMUNITY)
Admission: EM | Admit: 2015-04-01 | Discharge: 2015-04-01 | Disposition: A | Discharge status: Home / Self Care | Payer: No Typology Code available for payment source | Attending: Emergency Medicine | Admitting: Emergency Medicine

## 2015-04-01 DIAGNOSIS — Z791 Long term (current) use of non-steroidal anti-inflammatories (NSAID): Secondary | ICD-10-CM | POA: Insufficient documentation

## 2015-04-01 DIAGNOSIS — Z79899 Other long term (current) drug therapy: Secondary | ICD-10-CM | POA: Insufficient documentation

## 2015-04-01 DIAGNOSIS — G40909 Epilepsy, unspecified, not intractable, without status epilepticus: Secondary | ICD-10-CM | POA: Insufficient documentation

## 2015-04-01 DIAGNOSIS — I10 Essential (primary) hypertension: Secondary | ICD-10-CM | POA: Insufficient documentation

## 2015-04-01 DIAGNOSIS — J45909 Unspecified asthma, uncomplicated: Secondary | ICD-10-CM | POA: Insufficient documentation

## 2015-04-01 DIAGNOSIS — Z8669 Personal history of other diseases of the nervous system and sense organs: Secondary | ICD-10-CM | POA: Insufficient documentation

## 2015-04-01 DIAGNOSIS — Z8659 Personal history of other mental and behavioral disorders: Secondary | ICD-10-CM | POA: Insufficient documentation

## 2015-04-01 DIAGNOSIS — R110 Nausea: Secondary | ICD-10-CM | POA: Insufficient documentation

## 2015-04-01 DIAGNOSIS — J020 Streptococcal pharyngitis: Secondary | ICD-10-CM

## 2015-04-01 LAB — RAPID STREP SCREEN (MED CTR MEBANE ONLY): STREPTOCOCCUS, GROUP A SCREEN (DIRECT): POSITIVE — AB

## 2015-04-01 MED ORDER — PENICILLIN G BENZATHINE 1200000 UNIT/2ML IM SUSP
1.2000 10*6.[IU] | Freq: Once | INTRAMUSCULAR | Status: AC
  Administered 2015-04-01: 1.2 10*6.[IU] via INTRAMUSCULAR
  Filled 2015-04-01: qty 2

## 2015-04-01 MED ORDER — PREDNISONE 20 MG PO TABS: ORAL_TABLET | ORAL | Status: DC

## 2015-04-01 MED ORDER — MAGIC MOUTHWASH
10.0000 mL | Freq: Once | ORAL | Status: AC
  Administered 2015-04-01: 10 mL via ORAL
  Filled 2015-04-01: qty 10

## 2015-04-01 MED ORDER — HYDROCODONE-ACETAMINOPHEN 5-325 MG PO TABS: 1.0000 | ORAL_TABLET | Freq: Four times a day (QID) | ORAL | Status: DC | PRN

## 2015-04-01 NOTE — ED Notes (Signed)
Verbalized understanding discharge instructions. In no acute distress.  Pt educated not to drive or operate heavy machinery w/ taking pain medication.

## 2015-04-01 NOTE — ED Notes (Signed)
Pt c/o generalized pain, sore throat, chills, and nausea x 2 days.  Pain score 9/10.  Pt has not taken anything for symptoms.  Unknown if been around anyone sick.

## 2015-04-01 NOTE — Discharge Instructions (Signed)

## 2015-04-01 NOTE — ED Provider Notes (Signed)
CSN: [id number]     Arrival date & time 04/01/15  1333 History  This chart was scribed for non-physician practitioner, Andrew Helper, PA-C,working with Andrew Hong, MD, by Karle Plumber, ED Scribe. This patient was seen in room WTR5/WTR5 and the patient's care was started at 1:51 PM.  Chief Complaint  Patient presents with  . Generalized Body Aches  . Sore Throat  . Nausea   Patient is a 24 y.o. male presenting with pharyngitis. The history is provided by the patient and medical records. No language interpreter was used.  Sore Throat Pertinent negatives include no abdominal pain.    HPI Comments:  Andrew Larson is a 24 y.o. obese male who presents to the Emergency Department complaining of generalized body aches that began two days ago. He reports associated sore throat, neck pain, cough, rhinorrhea, nausea and diaphoresis. He reports having a migraine yesterday that has since subsided. He reports taking OTC Ibuprofen with complete relief of the headache. Pt states he has multiple sick contacts through his job and with his nieces and nephews. He denies abdominal pain, vomiting, otalgia, fever or chills. PMHx of HTN, asthma, bipolar disorder and sleep apnea.  Past Medical History  Diagnosis Date  . Hypertension   . Sleep apnea   . Asthma   . Mental disorder     bipolar  . Seizures     ?? age of 4-5   Past Surgical History  Procedure Laterality Date  . Hemorrhoid surgery N/A 02/14/2014    Procedure:  HEMORRHOIDECTOMY/RECTAL BIOPSY;  Surgeon: Liz Malady, MD;  Location: [facility] OR;  Service: General;  Laterality: N/A;  . Examination under anesthesia N/A 02/14/2014    Procedure: Francia Greaves UNDER ANESTHESIA;  Surgeon: Liz Malady, MD;  Location: [facility] OR;  Service: General;  Laterality: N/A;   Family History  Problem Relation Age of Onset  . Hypertension Mother   . Diabetes Mother   . Hypertension Father    History  Substance Use Topics  . Smoking status: Never Smoker   . Smokeless  tobacco: Never Used  . Alcohol Use: No    Review of Systems  Constitutional: Positive for diaphoresis. Negative for fever and chills.  HENT: Positive for rhinorrhea, sneezing and sore throat. Negative for dental problem and trouble swallowing.   Respiratory: Positive for cough.   Gastrointestinal: Positive for nausea. Negative for vomiting and abdominal pain.  Musculoskeletal: Positive for myalgias.    Allergies  Review of patient's allergies indicates no known allergies.  Home Medications   Prior to Admission medications   Medication Sig Start Date End Date Taking? Authorizing Provider  diazepam (VALIUM) 5 MG tablet Take 1 tablet (5 mg total) by mouth 2 (two) times daily as needed for muscle spasms (spasms). Patient not taking: Reported on 03/22/2015 01/23/15   Loren Racer, MD  docusate sodium (COLACE) 100 MG capsule Take 1 capsule (100 mg total) by mouth every 12 (twelve) hours. 03/22/15   Earley Favor, NP  HYDROcodone-acetaminophen (NORCO/VICODIN) 5-325 MG per tablet Take 1 tablet by mouth every 6 (six) hours as needed for moderate pain or severe pain. 01/23/15   Loren Racer, MD  ibuprofen (ADVIL,MOTRIN) 600 MG tablet Take 1,200 mg by mouth once.    Historical Provider, MD  lisinopril (PRINIVIL,ZESTRIL) 10 MG tablet Take 1 tablet (10 mg total) by mouth every other day. 02/21/14   Ozella Rocks, MD  methocarbamol (ROBAXIN) 500 MG tablet Take 2 tablets (1,000 mg total) by mouth every 8 (eight) hours  as needed for muscle spasms. Patient not taking: Reported on 03/22/2015 01/23/15   Loren Raceravid Yelverton, MD  naproxen (NAPROSYN) 500 MG tablet Take 1 tablet (500 mg total) by mouth 2 (two) times daily. Patient not taking: Reported on 03/22/2015 01/19/15   Antony MaduraKelly Humes, PA-C  phentermine 37.5 MG capsule Take 37.5 mg by mouth every morning.    Historical Provider, MD   Triage Vitals: BP 147/98 mmHg  Pulse 94  Temp(Src) 98.2 F (36.8 C) (Oral)  Resp 24  SpO2 98% Physical Exam   Constitutional: He is oriented to person, place, and time. He appears well-developed and well-nourished.  HENT:  Head: Normocephalic and atraumatic.  Right Ear: Tympanic membrane normal.  Left Ear: Tympanic membrane normal.  Nose: Rhinorrhea present.  Mouth/Throat: Uvula is midline and mucous membranes are normal. No trismus in the jaw. Oropharyngeal exudate present. No tonsillar abscesses.  Bilateral tonsillar enlargment with exudate and post nasal drip. No evidence of deep tissue infection.  Eyes: EOM are normal.  Neck: Normal range of motion.  No nuchal rigidity.  Cardiovascular: Normal rate, regular rhythm and normal heart sounds.  Exam reveals no gallop and no friction rub.   No murmur heard. Pulmonary/Chest: Effort normal and breath sounds normal. No respiratory distress. He has no wheezes. He has no rales.  Musculoskeletal: Normal range of motion.  Lymphadenopathy:    He has cervical adenopathy.  Neurological: He is alert and oriented to person, place, and time.  Skin: Skin is warm and dry.  Psychiatric: He has a normal mood and affect. His behavior is normal.  Nursing note and vitals reviewed.   ED Course  Procedures (including critical care time) DIAGNOSTIC STUDIES: Oxygen Saturation is 98% on RA, normal by my interpretation.   COORDINATION OF CARE: 1:58 PM- Will send rapid strep test. Pt verbalizes understanding and agrees to plan.  2:37 PM Strep positive.  Pt given bicillin 1.882mil unit IM once.  Pain medication and steroid given as prescription.    Medications - No data to display  Labs Review Labs Reviewed  RAPID STREP SCREEN - Abnormal; Notable for the following:    Streptococcus, Group A Screen (Direct) POSITIVE (*)    All other components within normal limits    Imaging Review No results found.   EKG Interpretation None      MDM   Final diagnoses:  Strep pharyngitis    BP 147/98 mmHg  Pulse 94  Temp(Src) 98.2 F (36.8 C) (Oral)  Resp 24   SpO2 98%   I personally performed the services described in this documentation, which was scribed in my presence. The recorded information has been reviewed and is accurate.    Andrew HelperBowie Nishan Ovens, PA-C 04/01/15 1437  Andrew HongBrian Miller, MD 04/01/15 [phone]

## 2015-04-01 NOTE — ED Notes (Signed)
PA at bedside.

## 2015-06-20 ENCOUNTER — Ambulatory Visit: Payer: No Typology Code available for payment source

## 2015-11-14 ENCOUNTER — Encounter (HOSPITAL_COMMUNITY): Payer: Self-pay | Admitting: Emergency Medicine

## 2015-11-14 ENCOUNTER — Emergency Department (HOSPITAL_COMMUNITY)
Admission: EM | Admit: 2015-11-14 | Discharge: 2015-11-14 | Disposition: A | Discharge status: Home / Self Care | Payer: No Typology Code available for payment source | Attending: Emergency Medicine | Admitting: Emergency Medicine

## 2015-11-14 DIAGNOSIS — Z8669 Personal history of other diseases of the nervous system and sense organs: Secondary | ICD-10-CM | POA: Insufficient documentation

## 2015-11-14 DIAGNOSIS — Y9389 Activity, other specified: Secondary | ICD-10-CM | POA: Insufficient documentation

## 2015-11-14 DIAGNOSIS — Y92410 Unspecified street and highway as the place of occurrence of the external cause: Secondary | ICD-10-CM | POA: Insufficient documentation

## 2015-11-14 DIAGNOSIS — Y998 Other external cause status: Secondary | ICD-10-CM | POA: Insufficient documentation

## 2015-11-14 DIAGNOSIS — Z8659 Personal history of other mental and behavioral disorders: Secondary | ICD-10-CM | POA: Insufficient documentation

## 2015-11-14 DIAGNOSIS — S161XXA Strain of muscle, fascia and tendon at neck level, initial encounter: Secondary | ICD-10-CM

## 2015-11-14 DIAGNOSIS — J45909 Unspecified asthma, uncomplicated: Secondary | ICD-10-CM | POA: Insufficient documentation

## 2015-11-14 DIAGNOSIS — M542 Cervicalgia: Secondary | ICD-10-CM

## 2015-11-14 DIAGNOSIS — I10 Essential (primary) hypertension: Secondary | ICD-10-CM | POA: Insufficient documentation

## 2015-11-14 DIAGNOSIS — Z79899 Other long term (current) drug therapy: Secondary | ICD-10-CM | POA: Insufficient documentation

## 2015-11-14 MED ORDER — IBUPROFEN 600 MG PO TABS: 600.0000 mg | ORAL_TABLET | Freq: Three times a day (TID) | ORAL | Status: DC | PRN

## 2015-11-14 MED ORDER — CYCLOBENZAPRINE HCL 10 MG PO TABS: 10.0000 mg | ORAL_TABLET | Freq: Three times a day (TID) | ORAL | Status: DC | PRN

## 2015-11-14 MED ORDER — IBUPROFEN 200 MG PO TABS
600.0000 mg | ORAL_TABLET | Freq: Once | ORAL | Status: AC
  Administered 2015-11-14: 600 mg via ORAL
  Filled 2015-11-14: qty 3

## 2015-11-14 NOTE — ED Notes (Signed)
Pt alert and oriented x4. Respirations even and unlabored, bilateral symmetrical rise and fall of chest. Skin warm and dry. In no acute distress. Denies needs.   
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
## 2015-11-14 NOTE — Discharge Instructions (Signed)
Cervical Sprain  A cervical sprain is an injury in the neck in which the strong, fibrous tissues (ligaments) that connect your neck bones stretch or tear. Cervical sprains can range from mild to severe. Severe cervical sprains can cause the neck vertebrae to be unstable. This can lead to damage of the spinal cord and can result in serious nervous system problems. The amount of time it takes for a cervical sprain to get better depends on the cause and extent of the injury. Most cervical sprains heal in 1 to 3 weeks.  CAUSES   Severe cervical sprains may be caused by:    Contact sport injuries (such as from football, rugby, wrestling, hockey, auto racing, gymnastics, diving, martial arts, or boxing).    Motor vehicle collisions.    Whiplash injuries. This is an injury from a sudden forward and backward whipping movement of the head and neck.   Falls.   Mild cervical sprains may be caused by:    Being in an awkward position, such as while cradling a telephone between your ear and shoulder.    Sitting in a chair that does not offer proper support.    Working at a poorly designed computer station.    Looking up or down for long periods of time.   SYMPTOMS    Pain, soreness, stiffness, or a burning sensation in the front, back, or sides of the neck. This discomfort may develop immediately after the injury or slowly, 24 hours or more after the injury.    Pain or tenderness directly in the middle of the back of the neck.    Shoulder or upper back pain.    Limited ability to move the neck.    Headache.    Dizziness.    Weakness, numbness, or tingling in the hands or arms.    Muscle spasms.    Difficulty swallowing or chewing.    Tenderness and swelling of the neck.   DIAGNOSIS   Most of the time your health care provider can diagnose a cervical sprain by taking your history and doing a physical exam. Your health care provider will ask about previous neck injuries and any known neck  problems, such as arthritis in the neck. X-rays may be taken to find out if there are any other problems, such as with the bones of the neck. Other tests, such as a CT scan or MRI, may also be needed.   TREATMENT   Treatment depends on the severity of the cervical sprain. Mild sprains can be treated with rest, keeping the neck in place (immobilization), and pain medicines. Severe cervical sprains are immediately immobilized. Further treatment is done to help with pain, muscle spasms, and other symptoms and may include:   Medicines, such as pain relievers, numbing medicines, or muscle relaxants.    Physical therapy. This may involve stretching exercises, strengthening exercises, and posture training. Exercises and improved posture can help stabilize the neck, strengthen muscles, and help stop symptoms from returning.   HOME CARE INSTRUCTIONS    Put ice on the injured area.     Put ice in a plastic bag.     Place a towel between your skin and the bag.     Leave the ice on for 15-20 minutes, 3-4 times a day.    If your injury was severe, you may have been given a cervical collar to wear. A cervical collar is a two-piece collar designed to keep your neck from moving while it heals.      Do not remove the collar unless instructed by your health care provider.    If you have long hair, keep it outside of the collar.    Ask your health care provider before making any adjustments to your collar. Minor adjustments may be required over time to improve comfort and reduce pressure on your chin or on the back of your head.    Ifyou are allowed to remove the collar for cleaning or bathing, follow your health care provider's instructions on how to do so safely.    Keep your collar clean by wiping it with mild soap and water and drying it completely. If the collar you have been given includes removable pads, remove them every 1-2 days and hand wash them with soap and water. Allow them to air dry. They should be completely  dry before you wear them in the collar.    If you are allowed to remove the collar for cleaning and bathing, wash and dry the skin of your neck. Check your skin for irritation or sores. If you see any, tell your health care provider.    Do not drive while wearing the collar.    Only take over-the-counter or prescription medicines for pain, discomfort, or fever as directed by your health care provider.    Keep all follow-up appointments as directed by your health care provider.    Keep all physical therapy appointments as directed by your health care provider.    Make any needed adjustments to your workstation to promote good posture.    Avoid positions and activities that make your symptoms worse.    Warm up and stretch before being active to help prevent problems.   SEEK MEDICAL CARE IF:    Your pain is not controlled with medicine.    You are unable to decrease your pain medicine over time as planned.    Your activity level is not improving as expected.   SEEK IMMEDIATE MEDICAL CARE IF:    You develop any bleeding.   You develop stomach upset.   You have signs of an allergic reaction to your medicine.    Your symptoms get worse.    You develop new, unexplained symptoms.    You have numbness, tingling, weakness, or paralysis in any part of your body.   MAKE SURE YOU:    Understand these instructions.   Will watch your condition.   Will get help right away if you are not doing well or get worse.     This information is not intended to replace advice given to you by your health care provider. Make sure you discuss any questions you have with your health care provider.     Document Released: 09/15/2007 Document Revised: 11/23/2013 Document Reviewed: 05/26/2013  Elsevier Interactive Patient Education 2016 Elsevier Inc.
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
## 2015-11-19 ENCOUNTER — Encounter (HOSPITAL_COMMUNITY): Payer: Self-pay | Admitting: Emergency Medicine

## 2015-11-19 ENCOUNTER — Emergency Department (HOSPITAL_COMMUNITY)
Admission: EM | Admit: 2015-11-19 | Discharge: 2015-11-19 | Disposition: A | Discharge status: Home / Self Care | Payer: No Typology Code available for payment source | Attending: Emergency Medicine | Admitting: Emergency Medicine

## 2015-11-19 DIAGNOSIS — Y92410 Unspecified street and highway as the place of occurrence of the external cause: Secondary | ICD-10-CM | POA: Insufficient documentation

## 2015-11-19 DIAGNOSIS — Z79899 Other long term (current) drug therapy: Secondary | ICD-10-CM | POA: Insufficient documentation

## 2015-11-19 DIAGNOSIS — J45909 Unspecified asthma, uncomplicated: Secondary | ICD-10-CM | POA: Insufficient documentation

## 2015-11-19 DIAGNOSIS — Z8659 Personal history of other mental and behavioral disorders: Secondary | ICD-10-CM | POA: Insufficient documentation

## 2015-11-19 DIAGNOSIS — S199XXA Unspecified injury of neck, initial encounter: Secondary | ICD-10-CM | POA: Insufficient documentation

## 2015-11-19 DIAGNOSIS — I10 Essential (primary) hypertension: Secondary | ICD-10-CM | POA: Insufficient documentation

## 2015-11-19 DIAGNOSIS — Y998 Other external cause status: Secondary | ICD-10-CM | POA: Insufficient documentation

## 2015-11-19 DIAGNOSIS — Z8669 Personal history of other diseases of the nervous system and sense organs: Secondary | ICD-10-CM | POA: Insufficient documentation

## 2015-11-19 DIAGNOSIS — S299XXA Unspecified injury of thorax, initial encounter: Secondary | ICD-10-CM | POA: Insufficient documentation

## 2015-11-19 DIAGNOSIS — S0990XA Unspecified injury of head, initial encounter: Secondary | ICD-10-CM | POA: Insufficient documentation

## 2015-11-19 DIAGNOSIS — Y9389 Activity, other specified: Secondary | ICD-10-CM | POA: Insufficient documentation

## 2015-11-19 DIAGNOSIS — M549 Dorsalgia, unspecified: Secondary | ICD-10-CM

## 2015-11-19 DIAGNOSIS — S4991XA Unspecified injury of right shoulder and upper arm, initial encounter: Secondary | ICD-10-CM | POA: Insufficient documentation

## 2015-11-19 MED ORDER — METHOCARBAMOL 500 MG PO TABS: 500.0000 mg | ORAL_TABLET | Freq: Three times a day (TID) | ORAL | Status: DC | PRN

## 2015-11-19 NOTE — Discharge Instructions (Signed)
Read the information below.  Use the prescribed medication as directed.  Please discuss all new medications with your pharmacist.  You may return to the Emergency Department at any time for worsening condition or any new symptoms that concern you.    °
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
## 2016-01-01 ENCOUNTER — Emergency Department (HOSPITAL_COMMUNITY)
Admission: EM | Admit: 2016-01-01 | Discharge: 2016-01-01 | Discharge status: Against Medical Advice | Payer: No Typology Code available for payment source | Attending: Emergency Medicine | Admitting: Emergency Medicine

## 2016-01-01 ENCOUNTER — Encounter (HOSPITAL_COMMUNITY): Payer: Self-pay | Admitting: Emergency Medicine

## 2016-01-01 DIAGNOSIS — J45909 Unspecified asthma, uncomplicated: Secondary | ICD-10-CM | POA: Insufficient documentation

## 2016-01-01 DIAGNOSIS — R1110 Vomiting, unspecified: Secondary | ICD-10-CM | POA: Insufficient documentation

## 2016-01-01 DIAGNOSIS — I10 Essential (primary) hypertension: Secondary | ICD-10-CM | POA: Insufficient documentation

## 2016-01-01 DIAGNOSIS — R1084 Generalized abdominal pain: Secondary | ICD-10-CM | POA: Insufficient documentation

## 2016-01-01 LAB — URINE MICROSCOPIC-ADD ON
BACTERIA UA: NONE SEEN
WBC, UA: NONE SEEN WBC/hpf (ref 0–5)

## 2016-01-01 LAB — URINALYSIS, ROUTINE W REFLEX MICROSCOPIC
BILIRUBIN URINE: NEGATIVE
GLUCOSE, UA: NEGATIVE mg/dL
Ketones, ur: NEGATIVE mg/dL
Leukocytes, UA: NEGATIVE
Nitrite: NEGATIVE
PH: 5.5 (ref 5.0–8.0)
Protein, ur: 30 mg/dL — AB
SPECIFIC GRAVITY, URINE: 1.026 (ref 1.005–1.030)

## 2016-01-01 LAB — CBC
HCT: 45 % (ref 39.0–52.0)
HEMOGLOBIN: 14.6 g/dL (ref 13.0–17.0)
MCH: 26.8 pg (ref 26.0–34.0)
MCHC: 32.4 g/dL (ref 30.0–36.0)
MCV: 82.6 fL (ref 78.0–100.0)
Platelets: 429 10*3/uL — ABNORMAL HIGH (ref 150–400)
RBC: 5.45 MIL/uL (ref 4.22–5.81)
RDW: 14.3 % (ref 11.5–15.5)
WBC: 10.1 10*3/uL (ref 4.0–10.5)

## 2016-01-01 LAB — COMPREHENSIVE METABOLIC PANEL
ALBUMIN: 4.4 g/dL (ref 3.5–5.0)
ALK PHOS: 74 U/L (ref 38–126)
ALT: 30 U/L (ref 17–63)
ANION GAP: 7 (ref 5–15)
AST: 23 U/L (ref 15–41)
BUN: 8 mg/dL (ref 6–20)
CALCIUM: 9.3 mg/dL (ref 8.9–10.3)
CHLORIDE: 104 mmol/L (ref 101–111)
CO2: 27 mmol/L (ref 22–32)
Creatinine, Ser: 0.83 mg/dL (ref 0.61–1.24)
GFR calc non Af Amer: >60 mL/min (ref >60)
GLUCOSE: 101 mg/dL — AB (ref 65–99)
Potassium: 4.6 mmol/L (ref 3.5–5.1)
SODIUM: 138 mmol/L (ref 135–145)
Total Bilirubin: 0.5 mg/dL (ref 0.3–1.2)
Total Protein: 7.9 g/dL (ref 6.5–8.1)

## 2016-01-01 LAB — LIPASE, BLOOD: LIPASE: 28 U/L (ref 11–51)

## 2016-01-01 MED ORDER — ONDANSETRON 4 MG PO TBDP
4.0000 mg | ORAL_TABLET | Freq: Once | ORAL | Status: DC | PRN
  Filled 2016-01-01: qty 1

## 2016-01-01 NOTE — ED Notes (Signed)
Pt c/o generalized abdominal pain with N/V/D since yesterday. Pt A&Ox4 and ambulatory. Pt denies fevers. Denies urinary symptoms.
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
## 2016-03-17 ENCOUNTER — Encounter (HOSPITAL_COMMUNITY): Payer: Self-pay | Admitting: Emergency Medicine

## 2016-03-17 ENCOUNTER — Emergency Department (HOSPITAL_COMMUNITY)
Admission: EM | Admit: 2016-03-17 | Discharge: 2016-03-17 | Disposition: A | Discharge status: Home / Self Care | Payer: Self-pay | Attending: Emergency Medicine | Admitting: Emergency Medicine

## 2016-03-17 ENCOUNTER — Emergency Department (HOSPITAL_COMMUNITY): Payer: Self-pay

## 2016-03-17 DIAGNOSIS — Y998 Other external cause status: Secondary | ICD-10-CM | POA: Insufficient documentation

## 2016-03-17 DIAGNOSIS — S29002A Unspecified injury of muscle and tendon of back wall of thorax, initial encounter: Secondary | ICD-10-CM | POA: Insufficient documentation

## 2016-03-17 DIAGNOSIS — Z79899 Other long term (current) drug therapy: Secondary | ICD-10-CM | POA: Insufficient documentation

## 2016-03-17 DIAGNOSIS — Y92410 Unspecified street and highway as the place of occurrence of the external cause: Secondary | ICD-10-CM | POA: Insufficient documentation

## 2016-03-17 DIAGNOSIS — Y9389 Activity, other specified: Secondary | ICD-10-CM | POA: Insufficient documentation

## 2016-03-17 DIAGNOSIS — I10 Essential (primary) hypertension: Secondary | ICD-10-CM | POA: Insufficient documentation

## 2016-03-17 DIAGNOSIS — J45909 Unspecified asthma, uncomplicated: Secondary | ICD-10-CM | POA: Insufficient documentation

## 2016-03-17 DIAGNOSIS — S199XXA Unspecified injury of neck, initial encounter: Secondary | ICD-10-CM | POA: Insufficient documentation

## 2016-03-17 MED ORDER — KETOROLAC TROMETHAMINE 60 MG/2ML IM SOLN
60.0000 mg | Freq: Once | INTRAMUSCULAR | Status: AC
  Administered 2016-03-17: 60 mg via INTRAMUSCULAR
  Filled 2016-03-17: qty 2

## 2016-03-17 NOTE — ED Notes (Signed)
Patient transported to X-ray 
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
## 2016-04-13 ENCOUNTER — Encounter (HOSPITAL_COMMUNITY): Payer: Self-pay | Admitting: Emergency Medicine

## 2016-04-13 ENCOUNTER — Emergency Department (HOSPITAL_COMMUNITY): Payer: No Typology Code available for payment source

## 2016-04-13 ENCOUNTER — Emergency Department (HOSPITAL_COMMUNITY)
Admission: EM | Admit: 2016-04-13 | Discharge: 2016-04-13 | Disposition: A | Discharge status: Home / Self Care | Payer: No Typology Code available for payment source | Attending: Emergency Medicine | Admitting: Emergency Medicine

## 2016-04-13 DIAGNOSIS — Y998 Other external cause status: Secondary | ICD-10-CM | POA: Insufficient documentation

## 2016-04-13 DIAGNOSIS — Y9389 Activity, other specified: Secondary | ICD-10-CM | POA: Insufficient documentation

## 2016-04-13 DIAGNOSIS — S59901A Unspecified injury of right elbow, initial encounter: Secondary | ICD-10-CM | POA: Insufficient documentation

## 2016-04-13 DIAGNOSIS — I10 Essential (primary) hypertension: Secondary | ICD-10-CM | POA: Insufficient documentation

## 2016-04-13 DIAGNOSIS — J45909 Unspecified asthma, uncomplicated: Secondary | ICD-10-CM | POA: Insufficient documentation

## 2016-04-13 DIAGNOSIS — Y92410 Unspecified street and highway as the place of occurrence of the external cause: Secondary | ICD-10-CM | POA: Insufficient documentation

## 2016-04-13 DIAGNOSIS — S8991XA Unspecified injury of right lower leg, initial encounter: Secondary | ICD-10-CM | POA: Insufficient documentation
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
## 2016-04-13 NOTE — ED Notes (Signed)
Pt was called to be taken to room x 1 and did not answer

## 2017-01-01 ENCOUNTER — Ambulatory Visit: Payer: No Typology Code available for payment source | Admitting: Family Medicine

## 2017-04-02 ENCOUNTER — Ambulatory Visit: Payer: Self-pay | Admitting: Internal Medicine

## 2017-06-25 ENCOUNTER — Encounter: Payer: Self-pay | Admitting: Family Medicine

## 2017-06-25 ENCOUNTER — Ambulatory Visit: Payer: Self-pay | Attending: Family Medicine | Admitting: Family Medicine

## 2017-06-25 VITALS — BP 142/80 | HR 69 | Temp 98.4°F | Resp 18 | Ht 72.0 in | Wt 326.4 lb

## 2017-06-25 DIAGNOSIS — I10 Essential (primary) hypertension: Secondary | ICD-10-CM | POA: Insufficient documentation

## 2017-06-25 DIAGNOSIS — Z6841 Body Mass Index (BMI) 40.0 and over, adult: Secondary | ICD-10-CM | POA: Insufficient documentation

## 2017-06-25 DIAGNOSIS — Z79899 Other long term (current) drug therapy: Secondary | ICD-10-CM | POA: Insufficient documentation

## 2017-06-25 DIAGNOSIS — Z8249 Family history of ischemic heart disease and other diseases of the circulatory system: Secondary | ICD-10-CM | POA: Insufficient documentation

## 2017-06-25 DIAGNOSIS — R7303 Prediabetes: Secondary | ICD-10-CM | POA: Insufficient documentation

## 2017-06-25 DIAGNOSIS — Z833 Family history of diabetes mellitus: Secondary | ICD-10-CM | POA: Insufficient documentation

## 2017-06-25 LAB — POCT UA - MICROALBUMIN
Creatinine, POC: 300 mg/dL
MICROALBUMIN (UR) POC: 80 mg/L

## 2017-06-25 LAB — GLUCOSE, POCT (MANUAL RESULT ENTRY): POC Glucose: 122 mg/dl — AB (ref 70–99)

## 2017-06-25 LAB — POCT GLYCOSYLATED HEMOGLOBIN (HGB A1C): Hemoglobin A1C: 5.9

## 2017-06-25 MED ORDER — HYDROCHLOROTHIAZIDE 12.5 MG PO TABS: 12.5000 mg | ORAL_TABLET | Freq: Every day | ORAL | 2 refills | Status: DC

## 2017-06-25 MED ORDER — AMLODIPINE BESYLATE 5 MG PO TABS: 5.0000 mg | ORAL_TABLET | Freq: Every day | ORAL | 2 refills | Status: DC

## 2017-06-25 NOTE — Progress Notes (Signed)
Patient is here for establish care   Patient is here for BP check

## 2017-06-25 NOTE — Patient Instructions (Signed)

## 2017-06-25 NOTE — Progress Notes (Signed)
Subjective:  Patient ID: Andrew Larson, male    DOB: [birth year]  Age: 26 y.o. MRN: [id number]  CC: Establish Care  HPI DILLIAN FEIG presents for follow-up of elevated blood pressure. He is not exercising and is not adherent to low salt diet. He does not check BP at home. He reports being without medications for 1 1/2 years. Family history of HTN -mother, father; DM-mother. Cardiac symptoms none. Patient denies chest pain, claudication, fatigue, lower extremity edema, near-syncope, palpitations and syncope.  Cardiovascular risk factors: hypertension, male gender, obesity (BMI >= 30 kg/m2), sedentary lifestyle and prediabetes. Use of agents associated with hypertension: none. History of target organ damage: none. History of obesity. He reports heaviest weight was 405 lbs. Reports seeing weight loss clinic in the past and used phentamine He reports anxious mood.  He has the following symptoms: diaphoresis, feelings of losing control, and irritability Onset of symptoms was approximately 1 year ago, unchanged since that time. He denies current suicidal and homicidal ideation. He is agreeable to speaking with LCSW.   Outpatient Medications Prior to Visit  Medication Sig Dispense Refill  . lisinopril-hydrochlorothiazide (PRINZIDE,ZESTORETIC) 20-12.5 MG tablet Take 1 tablet by mouth daily.     No facility-administered medications prior to visit.     ROS Review of Systems  Constitutional: Negative.   Eyes: Negative.   Respiratory: Negative.   Cardiovascular: Negative.   Gastrointestinal: Negative.   Skin: Negative.   Psychiatric/Behavioral: Negative.      Objective:  BP (!) 142/80 (BP Location: Left Arm, Patient Position: Sitting, Cuff Size: Normal)   Pulse 69   Temp 98.4 F (36.9 C) (Oral)   Resp 18   Ht 6' (1.829 m)   Wt (!) 326 lb 6.4 oz (148.1 kg)   SpO2 93%   BMI 44.27 kg/m   BP/Weight 06/25/2017 04/13/2016 4/31/5400  Systolic BP 867 619 509  Diastolic BP 80 95 90  Wt.  (Lbs) 326.4 305 336.44  BMI 44.27 41.36 44.4     Physical Exam  Constitutional: He appears well-developed and well-nourished.  obese  Eyes: Pupils are equal, round, and reactive to light. Conjunctivae are normal.  Neck: No JVD present.  Cardiovascular: Normal rate, regular rhythm, normal heart sounds and intact distal pulses.   Pulmonary/Chest: Effort normal and breath sounds normal.  Abdominal: Soft. Bowel sounds are normal.  Skin: Skin is warm and dry.  Nursing note and vitals reviewed.  Assessment & Plan:   Problem List Items Addressed This Visit      Other   Pre-diabetes   Relevant Orders   Glucose (CBG) (Completed)   HgB A1c (Completed)    Other Visit Diagnoses    Essential hypertension    -  Primary   Started on medications for hypertension    Schedule BP recheck in 2 weeks with clinic RN.   If BP is greater than 90/60 (MAP 65 or greater) but not less than 130/80 may increase     dose of amlodipine to 10 mg QD and recheck in another 2 weeks with clinic RN.   Relevant Medications   hydrochlorothiazide (HYDRODIURIL) 12.5 MG tablet   amLODipine (NORVASC) 5 MG tablet   Other Relevant Orders   POCT UA - Microalbumin (Completed)   CMP14+EGFR (Completed)   Lipid Panel (Completed)   Morbid obesity with BMI of 40.0-44.9, adult ([facility])       Relevant Orders   Amb ref to Medical Nutrition Therapy-MNT      Meds ordered this encounter  Medications  . hydrochlorothiazide (HYDRODIURIL) 12.5 MG tablet    Sig: Take 1 tablet (12.5 mg total) by mouth daily.    Dispense:  30 tablet    Refill:  2    Order Specific Question:   Supervising Provider    Answer:   Tresa Garter [id number]  . amLODipine (NORVASC) 5 MG tablet    Sig: Take 1 tablet (5 mg total) by mouth daily.    Dispense:  30 tablet    Refill:  2    Order Specific Question:   Supervising Provider    Answer:   Tresa Garter [id number]    Follow-up: Return in about 2 weeks (around 07/09/2017), or if  symptoms worsen or fail to improve, for BP check with Travia.   Alfonse Spruce FNP

## 2017-06-26 LAB — CMP14+EGFR
A/G RATIO: 1.7 (ref 1.2–2.2)
ALK PHOS: 59 IU/L (ref 39–117)
ALT: 22 IU/L (ref 0–44)
AST: 27 IU/L (ref 0–40)
Albumin: 4.3 g/dL (ref 3.5–5.5)
BILIRUBIN TOTAL: 0.3 mg/dL (ref 0.0–1.2)
BUN / CREAT RATIO: 9 (ref 9–20)
BUN: 9 mg/dL (ref 6–20)
CHLORIDE: 104 mmol/L (ref 96–106)
CO2: 22 mmol/L (ref 20–29)
CREATININE: 0.95 mg/dL (ref 0.76–1.27)
Calcium: 9.3 mg/dL (ref 8.7–10.2)
GFR calc Af Amer: 126 mL/min/{1.73_m2} (ref >59)
GFR calc non Af Amer: 109 mL/min/{1.73_m2} (ref >59)
GLOBULIN, TOTAL: 2.5 g/dL (ref 1.5–4.5)
Glucose: 85 mg/dL (ref 65–99)
POTASSIUM: 4.4 mmol/L (ref 3.5–5.2)
SODIUM: 141 mmol/L (ref 134–144)
Total Protein: 6.8 g/dL (ref 6.0–8.5)

## 2017-06-26 LAB — LIPID PANEL
CHOLESTEROL TOTAL: 169 mg/dL (ref 100–199)
Chol/HDL Ratio: 5.3 ratio — ABNORMAL HIGH (ref 0.0–5.0)
HDL: 32 mg/dL — ABNORMAL LOW (ref >39)
LDL Calculated: 102 mg/dL — ABNORMAL HIGH (ref 0–99)
TRIGLYCERIDES: 174 mg/dL — AB (ref 0–149)
VLDL Cholesterol Cal: 35 mg/dL (ref 5–40)

## 2017-07-01 ENCOUNTER — Telehealth: Payer: Self-pay

## 2017-07-01 ENCOUNTER — Other Ambulatory Visit: Payer: Self-pay | Admitting: Family Medicine

## 2017-07-01 DIAGNOSIS — E782 Mixed hyperlipidemia: Secondary | ICD-10-CM

## 2017-07-01 MED ORDER — PRAVASTATIN SODIUM 10 MG PO TABS: 10.0000 mg | ORAL_TABLET | Freq: Every day | ORAL | 2 refills | Status: DC

## 2017-07-01 NOTE — Telephone Encounter (Signed)
-----   Message from Lizbeth BarkMandesia R Hairston, FNP sent at 07/01/2017  8:36 AM EDT ----- Kidney function normal Liver function normal HgbA1c indicates prediabetes. Lipid levels were elevated. This can increase your risk of heart disease. You will be prescribed pravastatin to help lower risk. Start eating a diet low in saturated fat. Limit your intake of fried foods, red meats, and whole milk. Increase activity.  Recommend follow up in 3 months

## 2017-07-01 NOTE — Telephone Encounter (Signed)
CMA call regarding lab results  Patient did not answer & no VM set up  

## 2017-08-08 ENCOUNTER — Ambulatory Visit: Payer: Self-pay | Attending: Family Medicine | Admitting: Family Medicine

## 2017-08-08 ENCOUNTER — Encounter: Payer: Self-pay | Admitting: Family Medicine

## 2017-08-08 VITALS — BP 147/90 | HR 76 | Temp 98.7°F | Resp 18 | Ht 73.0 in | Wt 325.2 lb

## 2017-08-08 DIAGNOSIS — Z87898 Personal history of other specified conditions: Secondary | ICD-10-CM

## 2017-08-08 DIAGNOSIS — Z8249 Family history of ischemic heart disease and other diseases of the circulatory system: Secondary | ICD-10-CM | POA: Insufficient documentation

## 2017-08-08 DIAGNOSIS — Z79899 Other long term (current) drug therapy: Secondary | ICD-10-CM | POA: Insufficient documentation

## 2017-08-08 DIAGNOSIS — E669 Obesity, unspecified: Secondary | ICD-10-CM | POA: Insufficient documentation

## 2017-08-08 DIAGNOSIS — Z6841 Body Mass Index (BMI) 40.0 and over, adult: Secondary | ICD-10-CM | POA: Insufficient documentation

## 2017-08-08 DIAGNOSIS — I10 Essential (primary) hypertension: Secondary | ICD-10-CM | POA: Insufficient documentation

## 2017-08-08 DIAGNOSIS — R7303 Prediabetes: Secondary | ICD-10-CM | POA: Insufficient documentation

## 2017-08-08 DIAGNOSIS — J4520 Mild intermittent asthma, uncomplicated: Secondary | ICD-10-CM | POA: Insufficient documentation

## 2017-08-08 DIAGNOSIS — G4733 Obstructive sleep apnea (adult) (pediatric): Secondary | ICD-10-CM | POA: Insufficient documentation

## 2017-08-08 LAB — GLUCOSE, POCT (MANUAL RESULT ENTRY): POC Glucose: 98 mg/dl (ref 70–99)

## 2017-08-08 MED ORDER — MONTELUKAST SODIUM 10 MG PO TABS: 10.0000 mg | ORAL_TABLET | Freq: Every day | ORAL | 11 refills | Status: DC

## 2017-08-08 MED ORDER — ALBUTEROL SULFATE HFA 108 (90 BASE) MCG/ACT IN AERS: 2.0000 | INHALATION_SPRAY | Freq: Four times a day (QID) | RESPIRATORY_TRACT | 2 refills | Status: DC | PRN

## 2017-08-08 NOTE — Progress Notes (Deleted)
   Subjective:  Patient ID: Andrew Larson, male    DOB: Jan 23, 1991  Age: 26 y.o. MRN: [id number]  CC: Hypertension   HPI Andrew Larson presents for   Studies  Sleep apnea 5 years  CPAP     Outpatient Medications Prior to Visit  Medication Sig Dispense Refill  . amLODipine (NORVASC) 5 MG tablet Take 1 tablet (5 mg total) by mouth daily. 30 tablet 2  . hydrochlorothiazide (HYDRODIURIL) 12.5 MG tablet Take 1 tablet (12.5 mg total) by mouth daily. 30 tablet 2  . lisinopril-hydrochlorothiazide (PRINZIDE,ZESTORETIC) 20-12.5 MG tablet Take 1 tablet by mouth daily.    . pravastatin (PRAVACHOL) 10 MG tablet Take 1 tablet (10 mg total) by mouth daily. 30 tablet 2   No facility-administered medications prior to visit.     ROS Review of Systems      Objective:  BP (!) 147/90 (BP Location: Left Arm, Patient Position: Sitting, Cuff Size: Normal)   Pulse 76   Temp 98.7 F (37.1 C) (Oral)   Resp 18   Ht 6\' 1"  (1.854 m)   Wt (!) 325 lb 3.2 oz (147.5 kg)   SpO2 96%   BMI 42.90 kg/m   BP/Weight 08/08/2017 06/25/2017 04/13/2016  Systolic BP 147 142 146  Diastolic BP 90 80 95  Wt. (Lbs) 325.2 326.4 305  BMI 42.9 44.27 41.36     Physical Exam   Assessment & Plan:   Problem List Items Addressed This Visit      Other   Pre-diabetes - Primary   Relevant Orders   Glucose (CBG) (Completed)      No orders of the defined types were placed in this encounter.   Follow-up: No Follow-up on file.   Andrew BarkMandesia R Enedelia Martorelli FNP

## 2017-08-08 NOTE — Progress Notes (Signed)
Patient is here for f/up HTN   Patient has not taking his BP med for today   Has not eaten for today

## 2017-08-08 NOTE — Patient Instructions (Addendum)
Come back next week for BP check. Request records of sleep study  be faxed to our clinic at [phone].   Asthma, Adult Asthma is a condition of the lungs in which the airways tighten and narrow. Asthma can make it hard to breathe. Asthma cannot be cured, but medicine and lifestyle changes can help control it. Asthma may be started (triggered) by:  Animal skin flakes (dander).  Dust.  Cockroaches.  Pollen.  Mold.  Smoke.  Cleaning products.  Hair sprays or aerosol sprays.  Paint fumes or strong smells.  Cold air, weather changes, and winds.  Crying or laughing hard.  Stress.  Certain medicines or drugs.  Foods, such as dried fruit, potato chips, and sparkling grape juice.  Infections or conditions (colds, flu).  Exercise.  Certain medical conditions or diseases.  Exercise or tiring activities.  Follow these instructions at home:  Take medicine as told by your doctor.  Use a peak flow meter as told by your doctor. A peak flow meter is a tool that measures how well the lungs are working.  Record and keep track of the peak flow meter's readings.  Understand and use the asthma action plan. An asthma action plan is a written plan for taking care of your asthma and treating your attacks.  To help prevent asthma attacks: ? Do not smoke. Stay away from secondhand smoke. ? Change your heating and air conditioning filter often. ? Limit your use of fireplaces and wood stoves. ? Get rid of pests (such as roaches and mice) and their droppings. ? Throw away plants if you see mold on them. ? Clean your floors. Dust regularly. Use cleaning products that do not smell. ? Have someone vacuum when you are not home. Use a vacuum cleaner with a HEPA filter if possible. ? Replace carpet with wood, tile, or vinyl flooring. Carpet can trap animal skin flakes and dust. ? Use allergy-proof pillows, mattress covers, and box spring covers. ? Wash bed sheets and blankets every  week in hot water and dry them in a dryer. ? Use blankets that are made of polyester or cotton. ? Clean bathrooms and kitchens with bleach. If possible, have someone repaint the walls in these rooms with mold-resistant paint. Keep out of the rooms that are being cleaned and painted. ? Wash hands often. Contact a doctor if:  You have make a whistling sound when breaking (wheeze), have shortness of breath, or have a cough even if taking medicine to prevent attacks.  The colored mucus you cough up (sputum) is thicker than usual.  The colored mucus you cough up changes from clear or white to yellow, green, gray, or bloody.  You have problems from the medicine you are taking such as: ? A rash. ? Itching. ? Swelling. ? Trouble breathing.  You need reliever medicines more than 2-3 times a week.  Your peak flow measurement is still at 50-79% of your personal best after following the action plan for 1 hour.  You have a fever. Get help right away if:  You seem to be worse and are not responding to medicine during an asthma attack.  You are short of breath even at rest.  You get short of breath when doing very little activity.  You have trouble eating, drinking, or talking.  You have chest pain.  You have a fast heartbeat.  Your lips or fingernails start to turn blue.  You are light-headed, dizzy, or faint.  Your peak flow is less  than 50% of your personal best. This information is not intended to replace advice given to you by your health care provider. Make sure you discuss any questions you have with your health care provider. Document Released: 05/06/2008 Document Revised: 04/25/2016 Document Reviewed: 06/17/2013 Elsevier Interactive Patient Education  2017 [employer]

## 2017-08-10 NOTE — Progress Notes (Signed)
Subjective:  Patient ID: Andrew Larson, male    DOB: 04/04/1991  Age: 26 y.o. MRN: [id number]  CC: Hypertension  HPI Andrew Larson presents for follow-up of elevated blood pressure. He does not check BP at home. He reports being without medications for 1 1/2 years. Family history of HTN -mother, father; DM-mother. Cardiac symptoms none. Patient denies chest pain, claudication, fatigue, lower extremity edema, near-syncope, palpitations and syncope.  Cardiovascular risk factors: hypertension, male gender, obesity (BMI >= 30 kg/m2), sedentary lifestyle and prediabetes. Use of agents associated with hypertension: none. History of target organ damage: none.History of asthma. Patient's symptoms include dyspnea and wheezing that occurred several days ago.  Medications used in the past to treat these symptoms include beta agonist inhalers. He reports not having inhaler. Suspected precipitants include emotional upset and exercise. He also reports history of obstructive sleep apnea with last sleep study done within the last year. He denies CPAP use. He reports emotional upset today prior to office visit. He denies any HI/SI. He is declines speaking with LCSW or medication at this time.   Outpatient Medications Prior to Visit  Medication Sig Dispense Refill  . amLODipine (NORVASC) 5 MG tablet Take 1 tablet (5 mg total) by mouth daily. 30 tablet 2  . hydrochlorothiazide (HYDRODIURIL) 12.5 MG tablet Take 1 tablet (12.5 mg total) by mouth daily. 30 tablet 2  . pravastatin (PRAVACHOL) 10 MG tablet Take 1 tablet (10 mg total) by mouth daily. 30 tablet 2  . lisinopril-hydrochlorothiazide (PRINZIDE,ZESTORETIC) 20-12.5 MG tablet Take 1 tablet by mouth daily.     No facility-administered medications prior to visit.     ROS Review of Systems  Constitutional: Negative.   Eyes: Negative.   Respiratory: Negative.   Cardiovascular: Negative.   Gastrointestinal: Negative.   Skin: Negative.     Psychiatric/Behavioral: Negative.    Objective:  BP (!) 147/90 (BP Location: Left Arm, Patient Position: Sitting, Cuff Size: Normal)   Pulse 76   Temp 98.7 F (37.1 C) (Oral)   Resp 18   Ht  (1.854 m)   Wt (!) 325 lb 3.2 oz (147.5 kg)   SpO2 96%   BMI 42.90 kg/m   BP/Weight 08/08/2017 06/25/2017 04/13/2016  Systolic BP 147 142 146  Diastolic BP 90 80 95  Wt. (Lbs) 325.2 326.4 305  BMI 42.9 44.27 41.36     Physical Exam  Constitutional: He appears well-developed and well-nourished.  obese  Eyes: Pupils are equal, round, and reactive to light. Conjunctivae are normal.  Neck: No JVD present.  Cardiovascular: Normal rate, regular rhythm, normal heart sounds and intact distal pulses.   Pulmonary/Chest: Effort normal and breath sounds normal. No respiratory distress. He has no wheezes.  Abdominal: Soft. Bowel sounds are normal.  Skin: Skin is warm and dry.  Nursing note and vitals reviewed.  Assessment & Plan:   1. Essential hypertension  Patient reports not taking BP medications prior to office visit.  Recommend BP follow-up next week with clinic RN. Schedule BP recheck in 2 weeks with nurse. If BP is greater than 90/60 (MAP 65 or greater) but not less than 130/80 may increase dose of HTCZ to 25 mg and recheck in another 2 weeks  with clinic RN.  2. Mild intermittent asthma without complication Patient encouraged to have paperwork for most recent sleep study faxed over for review to order CPAP.  - albuterol (PROVENTIL HFA;VENTOLIN HFA) 108 (90 Base) MCG/ACT inhaler; Inhale 2 puffs into the lungs every 6 (  six) hours as needed for wheezing or shortness of breath.  Dispense: 1 Inhaler; Refill: 2 - montelukast (SINGULAIR) 10 MG tablet; Take 1 tablet (10 mg total) by mouth at bedtime.  Dispense: 30 tablet; Refill: 11  3. History of prediabetes  - Glucose (CBG)     Meds ordered this encounter  Medications  . albuterol (PROVENTIL HFA;VENTOLIN HFA) 108 (90 Base) MCG/ACT  inhaler    Sig: Inhale 2 puffs into the lungs every 6 (six) hours as needed for wheezing or shortness of breath.    Dispense:  1 Inhaler    Refill:  2    Order Specific Question:   Supervising Provider    Answer:   Quentin AngstJEGEDE, OLUGBEMIGA E [id number]]  . montelukast (SINGULAIR) 10 MG tablet    Sig: Take 1 tablet (10 mg total) by mouth at bedtime.    Dispense:  30 tablet    Refill:  11    Order Specific Question:   Supervising Provider    Answer:   Quentin AngstJEGEDE, OLUGBEMIGA E [[id number]]    Follow-up: Return in about 3 days (around 08/11/2017), or if symptoms worsen or fail to improve, for BP check with Travia.   Lizbeth BarkMandesia R Daemon Dowty FNP

## 2017-12-26 ENCOUNTER — Other Ambulatory Visit: Payer: Self-pay

## 2017-12-26 ENCOUNTER — Ambulatory Visit (HOSPITAL_COMMUNITY)
Admission: EM | Admit: 2017-12-26 | Discharge: 2017-12-26 | Disposition: A | Discharge status: Home / Self Care | Payer: Self-pay | Attending: Family Medicine | Admitting: Family Medicine

## 2017-12-26 ENCOUNTER — Encounter (HOSPITAL_COMMUNITY): Payer: Self-pay | Admitting: Emergency Medicine

## 2017-12-26 DIAGNOSIS — K0889 Other specified disorders of teeth and supporting structures: Secondary | ICD-10-CM

## 2017-12-26 DIAGNOSIS — K029 Dental caries, unspecified: Secondary | ICD-10-CM

## 2017-12-26 MED ORDER — DICLOFENAC SODIUM 75 MG PO TBEC: 75.0000 mg | DELAYED_RELEASE_TABLET | Freq: Two times a day (BID) | ORAL | 0 refills | Status: DC

## 2017-12-26 MED ORDER — PENICILLIN V POTASSIUM 500 MG PO TABS: 500.0000 mg | ORAL_TABLET | Freq: Three times a day (TID) | ORAL | 0 refills | Status: DC

## 2017-12-26 NOTE — ED Triage Notes (Signed)
Pt c/o toothache x1 week, unable to see dentist due to insurance.

## 2017-12-26 NOTE — Discharge Instructions (Signed)
You need to be seen by a dentist to get this properly treated.

## 2017-12-26 NOTE — ED Provider Notes (Signed)
  [facility]   [id number] 12/26/17 Arrival Time: 1825   SUBJECTIVE:  Andrew Larson is a 27 y.o. male who presents to the urgent care with complaint of toothache x1 week, unable to see dentist due to insurance.  Patient works in a call center.  Past Medical History:  Diagnosis Date  . Asthma   . Hypertension   . Mental disorder    bipolar  . Seizures (HCC)    ?? age of 4-5  . Sleep apnea    Family History  Problem Relation Age of Onset  . Hypertension Mother   . Diabetes Mother   . Hypertension Father    Social History   Socioeconomic History  . Marital status: Single    Spouse name: Not on file  . Number of children: Not on file  . Years of education: Not on file  . Highest education level: Not on file  Social Needs  . Financial resource strain: Not on file  . Food insecurity - worry: Not on file  . Food insecurity - inability: Not on file  . Transportation needs - medical: Not on file  . Transportation needs - non-medical: Not on file  Occupational History  . Not on file  Tobacco Use  . Smoking status: Never Smoker  . Smokeless tobacco: Never Used  Substance and Sexual Activity  . Alcohol use: No  . Drug use: No  . Sexual activity: Not on file  Other Topics Concern  . Not on file  Social History Narrative  . Not on file   No outpatient medications have been marked as taking for the 12/26/17 encounter [facility](Hospital Encounter).   No Known Allergies    ROS: As per HPI, remainder of ROS negative.   OBJECTIVE:   Vitals:   12/26/17 1849  BP: (!) 151/93  Pulse: 91  Resp: 18  Temp: 98.8 F (37.1 C)  SpO2: 100%     General appearance: alert; no distress Eyes: PERRL; EOMI; conjunctiva normal HENT: normocephalic; atraumatic; carie in tooth #31l without trauma; nasal mucosa normal; oral mucosa normal Neck: supple, no adenopathy Extremities: no cyanosis or edema; symmetrical with no gross deformities Skin: warm and dry Neurologic: normal  gait; grossly normal Psychological: alert and cooperative; normal mood and affect      Labs:  Results for orders placed or performed in visit on 08/08/17  Glucose (CBG)  Result Value Ref Range   POC Glucose 98 70 - 99 mg/dl    Labs Reviewed - No data to display  No results found.     ASSESSMENT & PLAN:  No diagnosis found.  No orders of the defined types were placed in this encounter.   Reviewed expectations re: course of current medical issues. Questions answered. Outlined signs and symptoms indicating need for more acute intervention. Patient verbalized understanding. After Visit Summary given.    Procedures:      Elvina SidleLauenstein, Malvern Kadlec, MD 12/26/17 [phone]

## 2019-02-04 ENCOUNTER — Emergency Department (HOSPITAL_BASED_OUTPATIENT_CLINIC_OR_DEPARTMENT_OTHER)
Admission: EM | Admit: 2019-02-04 | Discharge: 2019-02-04 | Disposition: A | Discharge status: Home / Self Care | Payer: Self-pay | Attending: Emergency Medicine | Admitting: Emergency Medicine

## 2019-02-04 ENCOUNTER — Encounter (HOSPITAL_BASED_OUTPATIENT_CLINIC_OR_DEPARTMENT_OTHER): Payer: Self-pay | Admitting: Emergency Medicine

## 2019-02-04 ENCOUNTER — Other Ambulatory Visit: Payer: Self-pay

## 2019-02-04 DIAGNOSIS — J45909 Unspecified asthma, uncomplicated: Secondary | ICD-10-CM | POA: Insufficient documentation

## 2019-02-04 DIAGNOSIS — I10 Essential (primary) hypertension: Secondary | ICD-10-CM | POA: Insufficient documentation

## 2019-02-04 DIAGNOSIS — Z79899 Other long term (current) drug therapy: Secondary | ICD-10-CM | POA: Insufficient documentation

## 2019-02-04 DIAGNOSIS — M549 Dorsalgia, unspecified: Secondary | ICD-10-CM | POA: Insufficient documentation

## 2019-02-04 MED ORDER — CYCLOBENZAPRINE HCL 10 MG PO TABS: 10.0000 mg | ORAL_TABLET | Freq: Two times a day (BID) | ORAL | 0 refills | Status: DC | PRN
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
## 2019-02-04 NOTE — Discharge Instructions (Signed)
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
## 2019-02-06 ENCOUNTER — Encounter (HOSPITAL_COMMUNITY): Payer: Self-pay

## 2019-02-06 ENCOUNTER — Other Ambulatory Visit: Payer: Self-pay

## 2019-02-06 ENCOUNTER — Emergency Department (HOSPITAL_COMMUNITY)
Admission: EM | Admit: 2019-02-06 | Discharge: 2019-02-06 | Disposition: A | Discharge status: Home / Self Care | Payer: Self-pay | Attending: Emergency Medicine | Admitting: Emergency Medicine

## 2019-02-06 DIAGNOSIS — Z79899 Other long term (current) drug therapy: Secondary | ICD-10-CM | POA: Insufficient documentation

## 2019-02-06 DIAGNOSIS — S39012A Strain of muscle, fascia and tendon of lower back, initial encounter: Secondary | ICD-10-CM

## 2019-02-06 DIAGNOSIS — I10 Essential (primary) hypertension: Secondary | ICD-10-CM | POA: Insufficient documentation

## 2019-02-06 DIAGNOSIS — S39012D Strain of muscle, fascia and tendon of lower back, subsequent encounter: Secondary | ICD-10-CM | POA: Insufficient documentation

## 2019-02-06 DIAGNOSIS — J45909 Unspecified asthma, uncomplicated: Secondary | ICD-10-CM | POA: Insufficient documentation

## 2019-02-06 NOTE — ED Notes (Signed)
Patient given discharge teaching and verbalized understanding. Patient ambulated out of ED with a steady gait. 

## 2019-02-06 NOTE — Discharge Instructions (Addendum)
Take your medication as directed. Follow up with your doctor or return here as needed.

## 2019-02-06 NOTE — ED Notes (Addendum)
Patient was able to ambulate to room from lobby area w/ a steady gate.
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
## 2021-07-05 ENCOUNTER — Encounter (HOSPITAL_BASED_OUTPATIENT_CLINIC_OR_DEPARTMENT_OTHER): Payer: Self-pay | Admitting: *Deleted

## 2021-07-05 ENCOUNTER — Emergency Department (HOSPITAL_BASED_OUTPATIENT_CLINIC_OR_DEPARTMENT_OTHER): Payer: Self-pay

## 2021-07-05 ENCOUNTER — Emergency Department (HOSPITAL_BASED_OUTPATIENT_CLINIC_OR_DEPARTMENT_OTHER)
Admission: EM | Admit: 2021-07-05 | Discharge: 2021-07-05 | Disposition: A | Discharge status: Home / Self Care | Payer: Self-pay | Attending: Emergency Medicine | Admitting: Emergency Medicine

## 2021-07-05 ENCOUNTER — Other Ambulatory Visit (HOSPITAL_BASED_OUTPATIENT_CLINIC_OR_DEPARTMENT_OTHER): Payer: Self-pay

## 2021-07-05 ENCOUNTER — Other Ambulatory Visit: Payer: Self-pay

## 2021-07-05 DIAGNOSIS — I10 Essential (primary) hypertension: Secondary | ICD-10-CM | POA: Insufficient documentation

## 2021-07-05 DIAGNOSIS — Z79899 Other long term (current) drug therapy: Secondary | ICD-10-CM | POA: Insufficient documentation

## 2021-07-05 DIAGNOSIS — Y9389 Activity, other specified: Secondary | ICD-10-CM | POA: Insufficient documentation

## 2021-07-05 DIAGNOSIS — J45909 Unspecified asthma, uncomplicated: Secondary | ICD-10-CM | POA: Insufficient documentation

## 2021-07-05 DIAGNOSIS — S62617A Displaced fracture of proximal phalanx of left little finger, initial encounter for closed fracture: Secondary | ICD-10-CM | POA: Insufficient documentation

## 2021-07-05 DIAGNOSIS — W228XXA Striking against or struck by other objects, initial encounter: Secondary | ICD-10-CM | POA: Insufficient documentation

## 2021-07-05 MED ORDER — HYDROCODONE-ACETAMINOPHEN 5-325 MG PO TABS
1.0000 | ORAL_TABLET | ORAL | 0 refills | Status: DC | PRN
  Filled 2021-07-05: qty 10, 2d supply, fill #0

## 2021-07-05 MED ORDER — LISINOPRIL-HYDROCHLOROTHIAZIDE 10-12.5 MG PO TABS
1.0000 | ORAL_TABLET | Freq: Every day | ORAL | 1 refills | Status: AC
  Filled 2021-07-05: qty 30, 30d supply, fill #0

## 2021-07-05 MED ORDER — IBUPROFEN 800 MG PO TABS
800.0000 mg | ORAL_TABLET | Freq: Once | ORAL | Status: AC
  Administered 2021-07-05: 800 mg via ORAL
  Filled 2021-07-05: qty 1

## 2021-07-05 NOTE — Discharge Instructions (Addendum)
Call the hand specialist today to set up your follow up appointment this week or next

## 2021-07-05 NOTE — ED Provider Notes (Signed)
[facility] EMERGENCY DEPT Provider Note   CSN: [id number] Arrival date & time: 07/05/21  0732     History Chief Complaint  Patient presents with   Finger Injury    Andrew Larson is a 30 y.o. male.  HPI 30 year old male presents with a left hand/pinky injury.  He was playing with his friend and they were swinging back-and-forth of the child there and somehow his friend hit him on his pinky he thinks.  No numbness.  He has a hard time fully bending his left finger.  He has not take anything for it.  It is swollen and painful at the base of his digit and distal hand.  Past Medical History:  Diagnosis Date   Asthma    Hypertension    Mental disorder    bipolar   Seizures (HCC)    ?? 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) 03/18/2014   External hemorrhoid 01/26/2014   Rectal mass 01/26/2014   Pre-diabetes 01/21/2014   Foot pain, right 12/29/2012   Hyperlipidemia 11/10/2012   Fatigue 07/27/2012   SLEEP APNEA, OBSTRUCTIVE, SEVERE 04/25/2010   SINUSITIS, CHRONIC 04/25/2010   OBESITY, NOS 01/29/2007   HYPERTENSION, BENIGN SYSTEMIC 01/29/2007   RHINITIS, ALLERGIC 01/29/2007   ASTHMA, UNSPECIFIED 01/29/2007    Past Surgical History:  Procedure Laterality Date   EXAMINATION UNDER ANESTHESIA N/A 02/14/2014   Procedure: EXAM UNDER ANESTHESIA;  Surgeon: Liz Malady, MD;  Location: [facility] OR;  Service: General;  Laterality: N/A;   HEMORRHOID SURGERY N/A 02/14/2014   Procedure:  HEMORRHOIDECTOMY/RECTAL BIOPSY;  Surgeon: Liz Malady, MD;  Location: [facility] OR;  Service: General;  Laterality: N/A;       Family History  Problem Relation Age of Onset   Hypertension Mother    Diabetes Mother    Hypertension Father     Social History   Tobacco Use   Smoking status: Never    Smokeless tobacco: Never  Vaping Use   Vaping Use: Never used  Substance Use Topics   Alcohol use: Not Currently   Drug use: Yes    Types: Marijuana    Comment: 2 days ago last used    Home Medications Prior to Admission medications   Medication Sig Start Date End Date Taking? Authorizing Provider  HYDROcodone-acetaminophen (NORCO) 5-325 MG tablet Take 1 tablet by mouth every 4 (four) hours as needed. 07/05/21  Yes Pricilla Loveless, MD  lisinopril-hydrochlorothiazide (ZESTORETIC) 10-12.5 MG tablet Take 1 tablet by mouth daily. 07/05/21   Pricilla Loveless, MD    Allergies    Patient has no known allergies.  Review of Systems   Review of Systems  Musculoskeletal:  Positive for arthralgias and joint swelling.  Neurological:  Negative for numbness.   Physical Exam Updated Vital Signs BP (!) 157/102 (BP Location: Right Arm)   Pulse 61   Temp 97.8 F (36.6 C) (Oral)   Resp 17   Ht 6\' 1"  (1.854 m)   Wt 134.4 kg   SpO2 100%   BMI 39.08 kg/m   Physical Exam Vitals and nursing note reviewed.  Constitutional:      Appearance: He is well-developed.  HENT:     Head: Normocephalic and atraumatic.     Right Ear: External ear normal.     Left Ear: External  ear normal.     Nose: Nose normal.  Eyes:     General:        Right eye: No discharge.        Left eye: No discharge.  Cardiovascular:     Rate and Rhythm: Normal rate and regular rhythm.     Pulses:          Radial pulses are 2+ on the left side.  Pulmonary:     Effort: Pulmonary effort is normal.  Abdominal:     General: There is no distension.  Musculoskeletal:     Left hand: Tenderness present. Decreased range of motion.     Comments: Tenderness and swelling over 5th MCP. Is able to flex and extend pinky against resistance but is painful. Grossly normal sensation  Skin:    General: Skin is warm and dry.  Neurological:     Mental Status: He is alert.  Psychiatric:        Mood and Affect: Mood is not anxious.    ED  Results / Procedures / Treatments   Labs (all labs ordered are listed, but only abnormal results are displayed) Labs Reviewed - No data to display  EKG None  Radiology DG Hand Complete Left  Result Date: 07/05/2021 CLINICAL DATA:  Left pinky finger swelling, jamming injury EXAM: LEFT HAND - COMPLETE 3+ VIEW COMPARISON:  None. FINDINGS: Fracture noted at the base of the left little finger proximal phalanx. Mild angulation. No subluxation or dislocation. Fracture is mildly comminuted. IMPRESSION: Mildly comminuted and angulated fracture of the base of the left little finger proximal phalanx. Electronically Signed   By: Charlett Nose M.D.   On: 07/05/2021 08:29    Procedures Procedures   Medications Ordered in ED Medications  ibuprofen (ADVIL) tablet 800 mg (800 mg Oral Given 07/05/21 0800)    ED Course  I have reviewed the triage vital signs and the nursing notes.  Pertinent labs & imaging results that were available during my care of the patient were reviewed by me and considered in my medical decision making (see chart for details).    MDM Rules/Calculators/A&P                           X-ray confirms fracture of his proximal pinky finger.  Mildly displaced.  We will put in splint and have him follow-up closely with hand surgeon.  This is a closed injury.  He will be given Norco for breakthrough pain at home.  He drove here so he cannot get in here.  Otherwise, he is noted to be hypertensive and states he has been out of his lisinopril/HCTZ for a while and does not have a family doctor.  He was encouraged he needs to follow-up with 1 and for now I will represcribe this medicine. Final Clinical Impression(s) / ED Diagnoses Final diagnoses:  Closed displaced fracture of proximal phalanx of left little finger, initial encounter    Rx / DC Orders ED Discharge Orders          Ordered    HYDROcodone-acetaminophen (NORCO) 5-325 MG tablet  Every 4 hours PRN        07/05/21 0840     lisinopril-hydrochlorothiazide (ZESTORETIC) 10-12.5 MG tablet  Daily        07/05/21 0852             Pricilla Loveless, MD 07/05/21 [phone]
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
## 2021-07-19 ENCOUNTER — Other Ambulatory Visit (HOSPITAL_BASED_OUTPATIENT_CLINIC_OR_DEPARTMENT_OTHER): Payer: Self-pay

## 2022-09-27 ENCOUNTER — Emergency Department (HOSPITAL_BASED_OUTPATIENT_CLINIC_OR_DEPARTMENT_OTHER)
Admission: EM | Admit: 2022-09-27 | Discharge: 2022-09-27 | Disposition: A | Discharge status: Home / Self Care | Payer: Self-pay | Attending: Emergency Medicine | Admitting: Emergency Medicine

## 2022-09-27 ENCOUNTER — Telehealth: Payer: Self-pay

## 2022-09-27 DIAGNOSIS — K649 Unspecified hemorrhoids: Secondary | ICD-10-CM | POA: Insufficient documentation

## 2022-09-27 DIAGNOSIS — Z79899 Other long term (current) drug therapy: Secondary | ICD-10-CM | POA: Insufficient documentation

## 2022-09-27 DIAGNOSIS — A630 Anogenital (venereal) warts: Secondary | ICD-10-CM | POA: Insufficient documentation

## 2022-09-27 DIAGNOSIS — I10 Essential (primary) hypertension: Secondary | ICD-10-CM | POA: Insufficient documentation

## 2022-09-27 MED ORDER — DIBUCAINE (PERIANAL) 1 % EX OINT: 1.0000 | TOPICAL_OINTMENT | CUTANEOUS | 0 refills | Status: DC | PRN

## 2022-09-27 MED ORDER — AMLODIPINE BESYLATE 5 MG PO TABS: 5.0000 mg | ORAL_TABLET | Freq: Every day | ORAL | 0 refills | Status: AC

## 2022-09-27 NOTE — ED Triage Notes (Signed)
Pt to ED c/o rectal pain x 1 week. Reports has hemorrhoids, hx of the same. Reports no relief with OTC medications.

## 2022-09-27 NOTE — Telephone Encounter (Signed)
Called patient regarding PCP set up. Patient did not answer the phone left a confidential voice message to call CM back to discuss referrals to PCP and case management in the community. He has had several ED visits.

## 2022-09-27 NOTE — Discharge Instructions (Addendum)
For your pain, you may take up to 1000mg  of acetaminophen (tylenol) 4 times daily for up to a week. This is the maximum dose of acetminophen (tylenol) you can take from all sources. Please check other over-the-counter medications and prescriptions to ensure you are not taking other medications that contain acetaminophen.  You may also take ibuprofen 400 mg 6 times a day OR 600mg  4 times a day alternating with or at the same time as tylenol.  You may use the topical numbing medicine 4 times a day, and continue to use the hctz suppositories you got over the counter. Avoid constipation, hydrate, take stool softeners (like docusate), fiber like metamucil/psillium, miralax as needed and follow up with General Surgery.

## 2022-09-27 NOTE — ED Notes (Signed)
Assisted Dr. Billy Fischer as a chaperone for patient exam.

## 2022-09-27 NOTE — ED Notes (Signed)
Pt reports hx HTN and is out of his bp medications

## 2022-09-27 NOTE — ED Provider Notes (Signed)
  [facility] EMERGENCY DEPT Provider Note   CSN: [id number] Arrival date & time: 09/27/22  4098     History {Add pertinent medical, surgical, social history, OB history to HPI:1} Chief Complaint  Patient presents with   Hemorrhoids   Rectal Pain    Andrew Larson is a 31 y.o. male.  HPI     31yo male with history of htn (currently off medications, takes mom's occasionally when high, took it yesterday) prior excision 2015 (thought to be hemorrhoid, found to be exophytic condyloma/mass) perianal condylomata, who presents with concern for rectal pain.  Pain started 1 week, ago. There all the time but with BM it is the worst.  Feels like something in the way.    In the past week going 2 days without having BM.  Had BM with bright red blood.  Took hemorrhoid suppository, and used bathroom but it was still painful.  Now having little drip of blood, not feeling like need to use it.  The pain is severe, wondering what to do to stop it.    No fever.  Yesterday felt like stomach a little off but not having other nausea, vomiting, abd pain Bright red blood if straining, small amount  Working on finding a new PCP     Home Medications Prior to Admission medications   Medication Sig Start Date End Date Taking? Authorizing Provider  HYDROcodone-acetaminophen (NORCO) 5-325 MG tablet Take 1 tablet by mouth every 4 (four) hours as needed. 07/05/21   Sherwood Gambler, MD  lisinopril-hydrochlorothiazide (ZESTORETIC) 10-12.5 MG tablet Take 1 tablet by mouth daily. 07/05/21   Sherwood Gambler, MD      Allergies    Patient has no known allergies.    Review of Systems   Review of Systems  Physical Exam Updated Vital Signs BP (!) 179/115 (BP Location: Right Arm)   Pulse 80   Temp 99.5 F (37.5 C) (Oral)   Resp 18   Ht 6\' 1"  (1.854 m)   Wt 126.1 kg   SpO2 99%   BMI 36.68 kg/m  Physical Exam  ED Results / Procedures / Treatments   Labs (all labs ordered are listed, but  only abnormal results are displayed) Labs Reviewed - No data to display  EKG None  Radiology No results found.  Procedures Procedures  {Document cardiac monitor, telemetry assessment procedure when appropriate:1}  Medications Ordered in ED Medications - No data to display  ED Course/ Medical Decision Making/ A&P                           Medical Decision Making  ***  {Document critical care time when appropriate:1} {Document review of labs and clinical decision tools ie heart score, Chads2Vasc2 etc:1}  {Document your independent review of radiology images, and any outside records:1} {Document your discussion with family members, caretakers, and with consultants:1} {Document social determinants of health affecting pt's care:1} {Document your decision making why or why not admission, treatments were needed:1} Final Clinical Impression(s) / ED Diagnoses Final diagnoses:  None    Rx / DC Orders ED Discharge Orders     None

## 2022-10-13 ENCOUNTER — Encounter (HOSPITAL_BASED_OUTPATIENT_CLINIC_OR_DEPARTMENT_OTHER): Payer: Self-pay | Admitting: Emergency Medicine

## 2022-10-13 ENCOUNTER — Other Ambulatory Visit: Payer: Self-pay

## 2022-10-13 ENCOUNTER — Emergency Department (HOSPITAL_BASED_OUTPATIENT_CLINIC_OR_DEPARTMENT_OTHER)
Admission: EM | Admit: 2022-10-13 | Discharge: 2022-10-13 | Disposition: A | Discharge status: Home / Self Care | Payer: No Typology Code available for payment source | Attending: Emergency Medicine | Admitting: Emergency Medicine

## 2022-10-13 DIAGNOSIS — Z79899 Other long term (current) drug therapy: Secondary | ICD-10-CM | POA: Insufficient documentation

## 2022-10-13 DIAGNOSIS — A630 Anogenital (venereal) warts: Secondary | ICD-10-CM | POA: Diagnosis not present

## 2022-10-13 DIAGNOSIS — I10 Essential (primary) hypertension: Secondary | ICD-10-CM | POA: Insufficient documentation

## 2022-10-13 DIAGNOSIS — K6289 Other specified diseases of anus and rectum: Secondary | ICD-10-CM | POA: Diagnosis present

## 2022-10-13 MED ORDER — LIDOCAINE 4 % EX GEL: 1.0000 g | Freq: Two times a day (BID) | CUTANEOUS | 0 refills | Status: DC

## 2022-10-13 NOTE — ED Triage Notes (Signed)
Patient presents POV reporting rectal pain. States he has warts around his rectum that have been painful and bleeding. Scheduled to see surgeon next Monday.

## 2022-10-13 NOTE — Discharge Instructions (Addendum)
You were seen today for evaluation of a anal wart.  I have prescribed a new lidocaine prescription.  Please pick this up and use it topically for numbing.  Please follow-up as scheduled with general surgery.  I do recommend taking a daily stool softener and drinking plenty of water.

## 2022-10-13 NOTE — ED Provider Notes (Signed)
[facility] EMERGENCY DEPT Provider Note   CSN: [id number] Arrival date & time: 10/13/22  1254     History  Chief Complaint  Patient presents with   Rectal Pain    Andrew Larson is a 31 y.o. male.  Patient with history of hypertension and previous excision of exophytic condyloma who presents with concerns of rectal pain.  Patient states pain began approximately 2 weeks ago.  He was evaluated the emergency department given outpatient prescriptions.  He states he continues to have some pain.  Patient states he has a scheduled appointment with surgery for November 20 but continues to have pain and is hoping for something better for pain control.  Pain increases when the patient uses the restroom.  Denies abdominal pain, nausea, vomiting.  HPI     Home Medications Prior to Admission medications   Medication Sig Start Date End Date Taking? Authorizing Provider  amLODipine (NORVASC) 5 MG tablet Take 1 tablet (5 mg total) by mouth daily. 09/27/22   Alvira Monday, MD  dibucaine (NUPERCAINAL) 1 % OINT Place 1 Application rectally as needed for hemorrhoids (sparingly as needed up to four times a day). 09/27/22   Alvira Monday, MD  HYDROcodone-acetaminophen (NORCO) 5-325 MG tablet Take 1 tablet by mouth every 4 (four) hours as needed. 07/05/21   Pricilla Loveless, MD  Lidocaine 4 % GEL Apply 1 g topically 2 (two) times daily. 10/13/22   Darrick Grinder, PA-C  lisinopril-hydrochlorothiazide (ZESTORETIC) 10-12.5 MG tablet Take 1 tablet by mouth daily. 07/05/21   Pricilla Loveless, MD      Allergies    Patient has no known allergies.    Review of Systems   Review of Systems  Gastrointestinal:  Positive for anal bleeding and rectal pain.    Physical Exam Updated Vital Signs BP (!) 144/105 (BP Location: Right Arm)   Pulse 92   Temp 98.5 F (36.9 C) (Oral)   Resp (!) 22   Ht 6\' 1"  (1.854 m)   Wt 135 kg   SpO2 99%   BMI 39.27 kg/m  Physical Exam Vitals and nursing  note reviewed.  Constitutional:      General: He is not in acute distress.    Appearance: He is obese.  HENT:     Head: Normocephalic and atraumatic.  Eyes:     Conjunctiva/sclera: Conjunctivae normal.  Cardiovascular:     Rate and Rhythm: Normal rate and regular rhythm.  Pulmonary:     Effort: Pulmonary effort is normal. No respiratory distress.     Breath sounds: Normal breath sounds.  Abdominal:     Palpations: Abdomen is soft.  Genitourinary:    Comments: Deferred Musculoskeletal:        General: No signs of injury.     Cervical back: Normal range of motion.  Skin:    General: Skin is dry.  Neurological:     Mental Status: He is alert.  Psychiatric:        Speech: Speech normal.        Behavior: Behavior normal.     ED Results / Procedures / Treatments   Labs (all labs ordered are listed, but only abnormal results are displayed) Labs Reviewed - No data to display  EKG None  Radiology No results found.  Procedures Procedures    Medications Ordered in ED Medications - No data to display  ED Course/ Medical Decision Making/ A&P  Medical Decision Making  Patient deferred exam today.  Notes from October 27 were reviewed which showed perianal condylomata with suspected hemorrhoid.  Patient was previously prescribed numbing medicine and recommended to take stool softeners, continue hydration, and avoid constipation.  He was also told that he could continue using hydrocortisone suppositories as needed.  Patient was also prescribed amlodipine due to uncontrolled blood pressure.  At this time I see nothing further that we can offer from the emergency side.  Patient should continue using topical lidocaine, stool softeners, and hydrocortisone suppositories that he finds them beneficial.  Patient needs to follow-up as scheduled with his surgery appointment on November 20.  Patient voices understanding with plan.        Final Clinical  Impression(s) / ED Diagnoses Final diagnoses:  Perianal condylomata    Rx / DC Orders ED Discharge Orders          Ordered    Lidocaine 4 % GEL  2 times daily,   Status:  Discontinued        10/13/22 1620    Lidocaine 4 % GEL  2 times daily        10/13/22 1629              Pamala Duffel 10/13/22 1630    Rondel Baton, MD 10/15/22 [phone]

## 2022-10-15 ENCOUNTER — Ambulatory Visit: Payer: Self-pay | Admitting: Surgery

## 2023-01-07 ENCOUNTER — Other Ambulatory Visit: Payer: Self-pay

## 2023-05-15 ENCOUNTER — Ambulatory Visit: Payer: No Typology Code available for payment source | Attending: Physical Therapy | Admitting: Physical Therapy

## 2023-05-26 ENCOUNTER — Ambulatory Visit: Payer: No Typology Code available for payment source | Admitting: Physical Therapy

## 2024-06-04 ENCOUNTER — Emergency Department (HOSPITAL_BASED_OUTPATIENT_CLINIC_OR_DEPARTMENT_OTHER): Payer: MEDICAID

## 2024-06-04 ENCOUNTER — Emergency Department (HOSPITAL_BASED_OUTPATIENT_CLINIC_OR_DEPARTMENT_OTHER): Payer: MEDICAID | Admitting: Radiology

## 2024-06-04 ENCOUNTER — Emergency Department (HOSPITAL_BASED_OUTPATIENT_CLINIC_OR_DEPARTMENT_OTHER)
Admission: EM | Admit: 2024-06-04 | Discharge: 2024-06-04 | Disposition: A | Discharge status: Home / Self Care | Payer: MEDICAID | Attending: Emergency Medicine | Admitting: Emergency Medicine

## 2024-06-04 ENCOUNTER — Other Ambulatory Visit: Payer: Self-pay

## 2024-06-04 ENCOUNTER — Encounter (HOSPITAL_BASED_OUTPATIENT_CLINIC_OR_DEPARTMENT_OTHER): Payer: Self-pay

## 2024-06-04 DIAGNOSIS — M545 Low back pain, unspecified: Secondary | ICD-10-CM | POA: Diagnosis present

## 2024-06-04 DIAGNOSIS — Y92410 Unspecified street and highway as the place of occurrence of the external cause: Secondary | ICD-10-CM | POA: Insufficient documentation

## 2024-06-04 DIAGNOSIS — J45909 Unspecified asthma, uncomplicated: Secondary | ICD-10-CM | POA: Insufficient documentation

## 2024-06-04 DIAGNOSIS — M25561 Pain in right knee: Secondary | ICD-10-CM | POA: Diagnosis not present

## 2024-06-04 DIAGNOSIS — I10 Essential (primary) hypertension: Secondary | ICD-10-CM | POA: Insufficient documentation

## 2024-06-04 DIAGNOSIS — Z79899 Other long term (current) drug therapy: Secondary | ICD-10-CM | POA: Diagnosis not present

## 2024-06-04 MED ORDER — CYCLOBENZAPRINE HCL 10 MG PO TABS: 10.0000 mg | ORAL_TABLET | Freq: Two times a day (BID) | ORAL | 0 refills | Status: DC | PRN

## 2024-06-04 MED ORDER — CELECOXIB 200 MG PO CAPS: 200.0000 mg | ORAL_CAPSULE | Freq: Two times a day (BID) | ORAL | 0 refills | Status: DC | PRN
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
## 2024-06-04 NOTE — Discharge Instructions (Addendum)
 As discussed, x-ray did not show any obvious fracture or dislocation.  Recommend use of anti-inflammatories for use of your pain.  Will also send in a muscle laxer to use as needed.  This medication can cause drowsiness so please not drive or perform any high-risk activity tubulous its effects on you.  Expect to feel worse over the next 2 to 3 days before you start to feel better.  Recommend follow-up with your primary care for reassessment of your symptoms.  Please do not hesitate to return to the emergency department if the worrisome signs and symptoms we discussed become apparent.
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
## 2024-09-24 ENCOUNTER — Telehealth: Payer: MEDICAID | Admitting: Physician Assistant

## 2024-09-24 DIAGNOSIS — H109 Unspecified conjunctivitis: Secondary | ICD-10-CM

## 2024-09-24 DIAGNOSIS — B9689 Other specified bacterial agents as the cause of diseases classified elsewhere: Secondary | ICD-10-CM | POA: Diagnosis not present

## 2024-09-24 MED ORDER — MOXIFLOXACIN HCL 0.5 % OP SOLN: 1.0000 [drp] | Freq: Three times a day (TID) | OPHTHALMIC | 0 refills | Status: AC

## 2024-09-24 NOTE — Patient Instructions (Signed)
 Andrew Larson, thank you for joining Delon CHRISTELLA Dickinson, PA-C for today's virtual visit.  While this provider is not your primary care provider (PCP), if your PCP is located in our provider database this encounter information will be shared with them immediately following your visit.   A [facility] MyChart account gives you access to today's visit and all your visits, tests, and labs performed at [facility]  click here if you don't have a [facility] MyChart account or go to [email or web address]  Consent: (Patient) Andrew Larson provided verbal consent for this virtual visit at the beginning of the encounter.  Current Medications:  Current Outpatient Medications:    moxifloxacin (VIGAMOX) 0.5 % ophthalmic solution, Place 1 drop into both eyes 3 (three) times daily., Disp: 3 mL, Rfl: 0   amLODipine  (NORVASC ) 5 MG tablet, Take 1 tablet (5 mg total) by mouth daily., Disp: 30 tablet, Rfl: 0   celecoxib  (CELEBREX ) 200 MG capsule, Take 1 capsule (200 mg total) by mouth 2 (two) times daily as needed., Disp: 30 capsule, Rfl: 0   cyclobenzaprine  (FLEXERIL ) 10 MG tablet, Take 1 tablet (10 mg total) by mouth 2 (two) times daily as needed for muscle spasms., Disp: 20 tablet, Rfl: 0   dibucaine (NUPERCAINAL) 1 % OINT, Place 1 Application rectally as needed for hemorrhoids (sparingly as needed up to four times a day)., Disp: 28 g, Rfl: 0   HYDROcodone -acetaminophen  (NORCO) 5-325 MG tablet, Take 1 tablet by mouth every 4 (four) hours as needed., Disp: 10 tablet, Rfl: 0   Lidocaine  4 % GEL, Apply 1 g topically 2 (two) times daily., Disp: 30 g, Rfl: 0   lisinopril -hydrochlorothiazide  (ZESTORETIC ) 10-12.5 MG tablet, Take 1 tablet by mouth daily., Disp: 30 tablet, Rfl: 1   Medications ordered in this encounter:  Meds ordered this encounter  Medications   moxifloxacin (VIGAMOX) 0.5 % ophthalmic solution    Sig: Place 1 drop into both eyes 3 (three) times daily.    Dispense:  3 mL     Refill:  0    Supervising Provider:   BLAISE ALEENE KIDD [[id number]]     *If you need refills on other medications prior to your next appointment, please contact your pharmacy*  Follow-Up: Call back or seek an in-person evaluation if the symptoms worsen or if the condition fails to improve as anticipated.  [facility] [phone]  Other Instructions Bacterial Conjunctivitis, Adult Bacterial conjunctivitis is an infection of the clear membrane that covers the white part of the eye and the inner surface of the eyelid (conjunctiva). When the blood vessels in the conjunctiva become inflamed, the eye becomes red or pink. The eye often feels irritated or itchy. Bacterial conjunctivitis spreads easily from person to person (is contagious). It also spreads easily from one eye to the other eye. What are the causes? This condition is caused by bacteria. You may get the infection if you come into close contact with: A person who is infected with the bacteria. Items that are contaminated with the bacteria, such as a face towel, contact lens solution, or eye makeup. What increases the risk? You are more likely to develop this condition if: You are exposed to other people who have the infection. You wear contact lenses. You have a sinus infection. You have had a recent eye injury or surgery. You have a weak body defense system (immune system). You have a medical condition that causes dry eyes. What are the signs or symptoms? Symptoms  of this condition include: Thick, yellowish discharge from the eye. This may turn into a crust on the eyelid overnight and cause your eyelids to stick together. Tearing or watery eyes. Itchy eyes. Burning feeling in your eyes. Eye redness. Swollen eyelids. Blurred vision. How is this diagnosed? This condition is diagnosed based on your symptoms and medical history. Your health care provider may also take a sample of discharge from your eye to find the  cause of your infection. How is this treated? This condition may be treated with: Antibiotic eye drops or ointment to clear the infection more quickly and prevent the spread of infection to others. Antibiotic medicines taken by mouth (orally) to treat infections that do not respond to drops or ointments or that last longer than 10 days. Cool, wet cloths (cool compresses) placed on the eyes. Artificial tears applied 2-6 times a day. Follow these instructions at home: Medicines Take or apply your antibiotic medicine as told by your health care provider. Do not stop using the antibiotic, even if your condition improves, unless directed by your health care provider. Take or apply over-the-counter and prescription medicines only as told by your health care provider. Be very careful to avoid touching the edge of your eyelid with the eye-drop bottle or the ointment tube when you apply medicines to the affected eye. This will keep you from spreading the infection to your other eye or to other people. Managing discomfort Gently wipe away any drainage from your eye with a warm, wet washcloth or a cotton ball. Apply a clean, cool compress to your eye for 10-20 minutes, 3-4 times a day. General instructions Do not wear contact lenses until the inflammation is gone and your health care provider says it is safe to wear them again. Ask your health care provider how to sterilize or replace your contact lenses before you use them again. Wear glasses until you can resume wearing contact lenses. Avoid wearing eye makeup until the inflammation is gone. Throw away any old eye cosmetics that may be contaminated. Change or wash your pillowcase every day. Do not share towels or washcloths. This may spread the infection. Wash your hands often with soap and water for at least 20 seconds and especially before touching your face or eyes. Use paper towels to dry your hands. Avoid touching or rubbing your eyes. Do not drive  or use heavy machinery if your vision is blurred. Contact a health care provider if: You have a fever. Your symptoms do not get better after 10 days. Get help right away if: You have a fever and your symptoms suddenly get worse. You have severe pain when you move your eye. You have facial pain, redness, or swelling. You have a sudden loss of vision. Summary Bacterial conjunctivitis is an infection of the clear membrane that covers the white part of the eye and the inner surface of the eyelid (conjunctiva). Bacterial conjunctivitis spreads easily from eye to eye and from person to person (is contagious). Wash your hands often with soap and water for at least 20 seconds and especially before touching your face or eyes. Use paper towels to dry your hands. Take or apply your antibiotic medicine as told by your health care provider. Do not stop using the antibiotic even if your condition improves. Contact a health care provider if you have a fever or if your symptoms do not get better after 10 days. Get help right away if you have a sudden loss of vision. This information  is not intended to replace advice given to you by your health care provider. Make sure you discuss any questions you have with your health care provider. Document Revised: 02/28/2021 Document Reviewed: 02/28/2021 Elsevier Patient Education  2024 [employer]   If you have been instructed to have an in-person evaluation today at a local Urgent Care facility, please use the link below. It will take you to a list of all of our available [facility], including address, phone number and hours of operation. Please do not delay care.  [facility]  If you or a family member do not have a primary care provider, use the link below to schedule a visit and establish care. When you choose a [facility] primary care physician or advanced practice provider, you gain a long-term partner in health. Find a Primary Care  Provider  Learn more about [facility] in-office and virtual care options: [facility] - Get Care Now

## 2024-09-24 NOTE — Progress Notes (Signed)
 Virtual Visit Consent   URA HAUSEN, you are scheduled for a virtual visit with a [facility] provider today. Just as with appointments in the office, your consent must be obtained to participate. Your consent will be active for this visit and any virtual visit you may have with one of our providers in the next 365 days. If you have a MyChart account, a copy of this consent can be sent to you electronically.  As this is a virtual visit, video technology does not allow for your provider to perform a traditional examination. This may limit your provider's ability to fully assess your condition. If your provider identifies any concerns that need to be evaluated in person or the need to arrange testing (such as labs, EKG, etc.), we will make arrangements to do so. Although advances in technology are sophisticated, we cannot ensure that it will always work on either your end or our end. If the connection with a video visit is poor, the visit may have to be switched to a telephone visit. With either a video or telephone visit, we are not always able to ensure that we have a secure connection.  By engaging in this virtual visit, you consent to the provision of healthcare and authorize for your insurance to be billed (if applicable) for the services provided during this visit. Depending on your insurance coverage, you may receive a charge related to this service.  I need to obtain your verbal consent now. Are you willing to proceed with your visit today? VASIL JUHASZ has provided verbal consent on 09/24/2024 for a virtual visit (video or telephone). Andrew CHRISTELLA Dickinson, PA-C  Date: 09/24/2024 2:12 PM   Virtual Visit via Video Note   I, Andrew Larson, connected with  Andrew Larson  ([id number], Jan 16, 1991) on 09/24/24 at  2:00 PM EDT by a video-enabled telemedicine application and verified that I am speaking with the correct person using two identifiers.  Location: Patient: Virtual Visit  Location Patient: Home Provider: Virtual Visit Location Provider: Home Office   I discussed the limitations of evaluation and management by telemedicine and the availability of in person appointments. The patient expressed understanding and agreed to proceed.    History of Present Illness: Andrew Larson is a 33 y.o. who identifies as a male who was assigned male at birth, and is being seen today for possible pink eye.  HPI: Conjunctivitis  The current episode started 5 to 7 days ago (Saturday, 09/18/24). The onset was sudden. The problem occurs continuously. The problem has been gradually worsening. The problem is mild. Nothing relieves the symptoms. Associated symptoms include decreased vision, eye itching, photophobia (mild and intermittent), eye discharge and eye redness. Pertinent negatives include no fever, no double vision, no congestion, no headaches, no muscle aches, no neck pain, no cough, no URI and no eye pain. Associated symptoms comments: Foreign body sensation. The eye pain is mild. Both eyes are affected. The eye pain is not associated with movement.     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) 03/18/2014   External hemorrhoid 01/26/2014   Rectal mass 01/26/2014   Pre-diabetes 01/21/2014   Foot pain, right 12/29/2012   Hyperlipidemia 11/10/2012   Fatigue 07/27/2012   SLEEP APNEA, OBSTRUCTIVE, SEVERE 04/25/2010   SINUSITIS, CHRONIC 04/25/2010   OBESITY, NOS 01/29/2007   HYPERTENSION,  BENIGN SYSTEMIC 01/29/2007   RHINITIS, ALLERGIC 01/29/2007   ASTHMA, UNSPECIFIED 01/29/2007    Allergies:  Allergies  Allergen Reactions   Tramadol Rash   Medications:  Current Outpatient Medications:    moxifloxacin (VIGAMOX) 0.5 % ophthalmic solution, Place 1 drop into both eyes 3 (three) times daily., Disp: 3 mL, Rfl:  0   amLODipine  (NORVASC ) 5 MG tablet, Take 1 tablet (5 mg total) by mouth daily., Disp: 30 tablet, Rfl: 0   celecoxib  (CELEBREX ) 200 MG capsule, Take 1 capsule (200 mg total) by mouth 2 (two) times daily as needed., Disp: 30 capsule, Rfl: 0   cyclobenzaprine  (FLEXERIL ) 10 MG tablet, Take 1 tablet (10 mg total) by mouth 2 (two) times daily as needed for muscle spasms., Disp: 20 tablet, Rfl: 0   dibucaine (NUPERCAINAL) 1 % OINT, Place 1 Application rectally as needed for hemorrhoids (sparingly as needed up to four times a day)., Disp: 28 g, Rfl: 0   HYDROcodone -acetaminophen  (NORCO) 5-325 MG tablet, Take 1 tablet by mouth every 4 (four) hours as needed., Disp: 10 tablet, Rfl: 0   Lidocaine  4 % GEL, Apply 1 g topically 2 (two) times daily., Disp: 30 g, Rfl: 0   lisinopril -hydrochlorothiazide  (ZESTORETIC ) 10-12.5 MG tablet, Take 1 tablet by mouth daily., Disp: 30 tablet, Rfl: 1  Observations/Objective: Patient is well-developed, well-nourished in no acute distress.  Resting comfortably  Head is normocephalic, atraumatic.  No labored breathing.  Speech is clear and coherent with logical content.  Patient is alert and oriented at baseline.  Bilateral conjunctiva is injected with significant watering, EOM grossly intact, Pupils are round and equal  Assessment and Plan: 1. Bacterial conjunctivitis of both eyes (Primary) - moxifloxacin (VIGAMOX) 0.5 % ophthalmic solution; Place 1 drop into both eyes 3 (three) times daily.  Dispense: 3 mL; Refill: 0  - Suspect bacterial conjunctivitis - Vigamox prescribed - Warm compresses - Good hand hygiene - Seek in person evaluation if symptoms worsen or fail to improve   Follow Up Instructions: I discussed the assessment and treatment plan with the patient. The patient was provided an opportunity to ask questions and all were answered. The patient agreed with the plan and demonstrated an understanding of the instructions.  A copy of instructions were sent to  the patient via MyChart unless otherwise noted below.    The patient was advised to call back or seek an in-person evaluation if the symptoms worsen or if the condition fails to improve as anticipated.    Andrew CHRISTELLA Dickinson, PA-C

## 2024-09-30 ENCOUNTER — Telehealth: Payer: MEDICAID | Admitting: Physician Assistant

## 2024-09-30 DIAGNOSIS — R197 Diarrhea, unspecified: Secondary | ICD-10-CM

## 2024-10-01 NOTE — Progress Notes (Signed)
 We are sorry that you are not feeling well.  Here is how we plan to help!  Based on what you have shared with me it looks like you have Acute Infectious Diarrhea.  Most cases of acute diarrhea are due to infections with virus and bacteria and are self-limited conditions lasting less than 14 days.  For your symptoms you may take Imodium 2 mg tablets that are over the counter at your local pharmacy. Take two tablet now and then one after each loose stool up to 6 a day.   Antibiotics are not needed for most people with diarrhea.  We do not prescribe long-term medications, like Linzess. For this you will need to speak with your Primary Care Office.   HOME CARE We recommend changing your diet to help with your symptoms for the next few days. Drink plenty of fluids that contain water salt and sugar. Sports drinks such as Gatorade may help.  You may try broths, soups, bananas, applesauce, soft breads, mashed potatoes or crackers.  You are considered infectious for as long as the diarrhea continues. Hand washing or use of alcohol based hand sanitizers is recommend. It is best to stay out of work or school until your symptoms stop.   GET HELP RIGHT AWAY If you have dark yellow colored urine or do not pass urine frequently you should drink more fluids.   If your symptoms worsen  If you feel like you are going to pass out (faint) You have a new problem  MAKE SURE YOU  Understand these instructions. Will watch your condition. Will get help right away if you are not doing well or get worse.  Your e-visit answers were reviewed by a board certified advanced clinical practitioner to complete your personal care plan.  Depending on the condition, your plan could have included both over the counter or prescription medications.  If there is a problem please reply  once you have received a response from your provider.  Your safety is important to us .  If you have drug allergies check your prescription  carefully.    You can use MyChart to ask questions about today's visit, request a non-urgent call back, or ask for a work or school excuse for 24 hours related to this e-Visit. If it has been greater than 24 hours you will need to follow up with your provider, or enter a new e-Visit to address those concerns.   You will get an e-mail in the next two days asking about your experience.  I hope that your e-visit has been valuable and will speed your recovery. Thank you for using e-visits.   I have spent 5 minutes in review of e-visit questionnaire, review and updating patient chart, medical decision making and response to patient.   Delon CHRISTELLA Dickinson, PA-C

## 2024-12-21 ENCOUNTER — Telehealth: Payer: MEDICAID | Admitting: Physician Assistant

## 2024-12-21 DIAGNOSIS — J069 Acute upper respiratory infection, unspecified: Secondary | ICD-10-CM

## 2024-12-21 MED ORDER — PSEUDOEPH-BROMPHEN-DM 30-2-10 MG/5ML PO SYRP: 5.0000 mL | ORAL_SOLUTION | Freq: Four times a day (QID) | ORAL | 0 refills | Status: AC | PRN

## 2024-12-21 NOTE — Progress Notes (Signed)
